# Patient Record
Sex: Male | Born: 1960
Health system: Southern US, Community
[De-identification: ages and names within clinical notes are randomized; demographics above are authoritative.]

## PROBLEM LIST (undated history)

## (undated) DIAGNOSIS — I10 Essential (primary) hypertension: Secondary | ICD-10-CM

## (undated) DIAGNOSIS — B001 Herpesviral vesicular dermatitis: Secondary | ICD-10-CM

## (undated) DIAGNOSIS — E785 Hyperlipidemia, unspecified: Secondary | ICD-10-CM

## (undated) DIAGNOSIS — H6120 Impacted cerumen, unspecified ear: Secondary | ICD-10-CM

## (undated) DIAGNOSIS — D489 Neoplasm of uncertain behavior, unspecified: Secondary | ICD-10-CM

## (undated) HISTORY — PX: APPENDECTOMY: SHX54

## (undated) HISTORY — DX: Neoplasm of uncertain behavior, unspecified: D48.9

## (undated) HISTORY — DX: Herpesviral vesicular dermatitis: B00.1

## (undated) HISTORY — DX: Hyperlipidemia, unspecified: E78.5

## (undated) HISTORY — PX: SHOULDER SURGERY: SHX246

## (undated) HISTORY — DX: Essential (primary) hypertension: I10

## (undated) HISTORY — DX: Impacted cerumen, unspecified ear: H61.20

---

## 2009-10-08 LAB — HM COLONOSCOPY

## 2009-12-31 ENCOUNTER — Encounter: Admission: RE | Admit: 2009-12-31 | Discharge: 2010-03-31 | Payer: Self-pay | Admitting: Orthopedic Surgery

## 2010-11-21 LAB — FECAL OCCULT BLOOD, GUAIAC: Fecal Occult Blood: NEGATIVE

## 2010-11-27 ENCOUNTER — Encounter: Payer: Self-pay | Admitting: Family Medicine

## 2010-11-27 DIAGNOSIS — E785 Hyperlipidemia, unspecified: Secondary | ICD-10-CM | POA: Insufficient documentation

## 2010-11-27 DIAGNOSIS — E1169 Type 2 diabetes mellitus with other specified complication: Secondary | ICD-10-CM | POA: Insufficient documentation

## 2010-11-27 DIAGNOSIS — B001 Herpesviral vesicular dermatitis: Secondary | ICD-10-CM | POA: Insufficient documentation

## 2010-11-27 DIAGNOSIS — H6120 Impacted cerumen, unspecified ear: Secondary | ICD-10-CM | POA: Insufficient documentation

## 2010-11-27 DIAGNOSIS — B009 Herpesviral infection, unspecified: Secondary | ICD-10-CM | POA: Insufficient documentation

## 2010-11-27 DIAGNOSIS — D489 Neoplasm of uncertain behavior, unspecified: Secondary | ICD-10-CM

## 2013-03-28 ENCOUNTER — Other Ambulatory Visit (INDEPENDENT_AMBULATORY_CARE_PROVIDER_SITE_OTHER): Payer: BC Managed Care – PPO

## 2013-03-28 DIAGNOSIS — R5381 Other malaise: Secondary | ICD-10-CM

## 2013-03-28 DIAGNOSIS — I10 Essential (primary) hypertension: Secondary | ICD-10-CM

## 2013-03-28 DIAGNOSIS — E559 Vitamin D deficiency, unspecified: Secondary | ICD-10-CM

## 2013-03-28 DIAGNOSIS — R5383 Other fatigue: Secondary | ICD-10-CM

## 2013-03-28 DIAGNOSIS — E785 Hyperlipidemia, unspecified: Secondary | ICD-10-CM

## 2013-03-28 LAB — HEPATIC FUNCTION PANEL
ALT: 22 U/L (ref 0–53)
AST: 28 U/L (ref 0–37)
Albumin: 4.4 g/dL (ref 3.5–5.2)
Alkaline Phosphatase: 60 U/L (ref 39–117)
Bilirubin, Direct: 0.1 mg/dL (ref 0.0–0.3)
Indirect Bilirubin: 0.5 mg/dL (ref 0.0–0.9)
Total Bilirubin: 0.6 mg/dL (ref 0.3–1.2)
Total Protein: 6.8 g/dL (ref 6.0–8.3)

## 2013-03-28 LAB — POCT CBC
Granulocyte percent: 72.8 %G (ref 37–80)
HCT, POC: 44.8 % (ref 43.5–53.7)
Hemoglobin: 14.9 g/dL (ref 14.1–18.1)
Lymph, poc: 1.1 (ref 0.6–3.4)
MCH, POC: 29.1 pg (ref 27–31.2)
MCHC: 33.3 g/dL (ref 31.8–35.4)
MCV: 87.5 fL (ref 80–97)
MPV: 8.1 fL (ref 0–99.8)
POC Granulocyte: 3.6 (ref 2–6.9)
POC LYMPH PERCENT: 21.6 %L (ref 10–50)
Platelet Count, POC: 168 10*3/uL (ref 142–424)
RBC: 5.1 M/uL (ref 4.69–6.13)
RDW, POC: 13.2 %
WBC: 5 10*3/uL (ref 4.6–10.2)

## 2013-03-28 LAB — BASIC METABOLIC PANEL WITH GFR
BUN: 14 mg/dL (ref 6–23)
CO2: 28 mEq/L (ref 19–32)
Calcium: 9.3 mg/dL (ref 8.4–10.5)
Chloride: 105 mEq/L (ref 96–112)
Creat: 0.95 mg/dL (ref 0.50–1.35)
GFR, Est African American: >89 mL/min
GFR, Est Non African American: >89 mL/min
Glucose, Bld: 99 mg/dL (ref 70–99)
Potassium: 4.8 mEq/L (ref 3.5–5.3)
Sodium: 138 mEq/L (ref 135–145)

## 2013-03-28 NOTE — Progress Notes (Signed)
Patient came in for labs only.

## 2013-03-28 NOTE — Progress Notes (Signed)
Pt here for labs only. 

## 2013-03-29 LAB — NMR LIPOPROFILE WITH LIPIDS
Cholesterol, Total: 117 mg/dL (ref <200)
HDL Particle Number: 33.9 umol/L (ref >=30.5)
HDL Size: 8.5 nm — ABNORMAL LOW (ref >=9.2)
HDL-C: 40 mg/dL (ref >=40)
LDL (calc): 60 mg/dL (ref <100)
LDL Particle Number: 865 nmol/L (ref <1000)
LDL Size: 20.2 nm — ABNORMAL LOW (ref >20.5)
LP-IR Score: 64 — ABNORMAL HIGH (ref <=45)
Large HDL-P: <1.3 umol/L — ABNORMAL LOW (ref >=4.8)
Large VLDL-P: 1.4 nmol/L (ref <=2.7)
Small LDL Particle Number: 516 nmol/L (ref <=527)
Triglycerides: 83 mg/dL (ref <150)
VLDL Size: 51.3 nm — ABNORMAL HIGH (ref <=46.6)

## 2013-03-29 LAB — VITAMIN D 25 HYDROXY (VIT D DEFICIENCY, FRACTURES): Vit D, 25-Hydroxy: 54 ng/mL (ref 30–89)

## 2013-03-30 ENCOUNTER — Telehealth: Payer: Self-pay | Admitting: *Deleted

## 2013-03-30 NOTE — Telephone Encounter (Signed)
Message copied by Bearl Mulberry on Thu Mar 30, 2013  6:16 PM ------      Message from: Ernestina Penna      Created: Tue Mar 28, 2013  1:11 PM       Normal CBC including hemoglobin WBC and platelets       ------

## 2013-03-30 NOTE — Telephone Encounter (Signed)
Pt notified of results

## 2013-03-30 NOTE — Telephone Encounter (Signed)
Message copied by Bearl Mulberry on Thu Mar 30, 2013  6:16 PM ------      Message from: Ernestina Penna      Created: Wed Mar 29, 2013  2:14 PM       Electrolytes blood sugar and renal function all within normal      Liver function tests within normal limit      All cholesterol numbers by advanced lipid testing were excellent--- continue current treatment and aggressive therapeutic lifestyle changes      Vitamin D was excellent and within normal limits at 54 ------

## 2013-04-03 ENCOUNTER — Encounter: Payer: Self-pay | Admitting: Family Medicine

## 2013-04-03 ENCOUNTER — Ambulatory Visit (INDEPENDENT_AMBULATORY_CARE_PROVIDER_SITE_OTHER): Payer: BC Managed Care – PPO | Admitting: Family Medicine

## 2013-04-03 VITALS — BP 112/78 | HR 82 | Temp 97.0°F | Ht 72.0 in | Wt 236.8 lb

## 2013-04-03 DIAGNOSIS — H6123 Impacted cerumen, bilateral: Secondary | ICD-10-CM

## 2013-04-03 DIAGNOSIS — H6120 Impacted cerumen, unspecified ear: Secondary | ICD-10-CM

## 2013-04-03 DIAGNOSIS — E785 Hyperlipidemia, unspecified: Secondary | ICD-10-CM

## 2013-04-03 DIAGNOSIS — I10 Essential (primary) hypertension: Secondary | ICD-10-CM

## 2013-04-03 DIAGNOSIS — E1159 Type 2 diabetes mellitus with other circulatory complications: Secondary | ICD-10-CM | POA: Insufficient documentation

## 2013-04-03 NOTE — Patient Instructions (Signed)
Continue aggressive therapeutic lifestyle changes Drink plenty of fluids Debrox over-the-counter, 2-3 drops nightly for 3 nights each ear, weight 1 week and repeat

## 2013-04-03 NOTE — Progress Notes (Signed)
  Subjective:    Patient ID: Jeffrey Barnett, male    DOB: April 05, 1961, 52 y.o.   MRN: [id number]  HPI Patient returns to clinic today for followup of chronic medical problems and their management. These include hyperlipidemia hypertension and vitamin D deficiency. Patient along with his wife and her diet has lost about 20lb.   Review of Systems  Constitutional: Negative.   HENT: Positive for sore throat (slight on the Left). Negative for congestion, postnasal drip and sinus pressure.   Eyes: Negative.  Negative for pain, discharge, redness and itching.  Respiratory: Negative.  Negative for cough, chest tightness, shortness of breath and wheezing.   Cardiovascular: Negative for chest pain, palpitations and leg swelling.  Gastrointestinal: Negative.  Negative for nausea, vomiting, abdominal pain, diarrhea and blood in stool.  Endocrine: Negative.   Genitourinary: Negative for dysuria and frequency.  Musculoskeletal: Negative.  Negative for back pain, joint swelling and arthralgias.  Allergic/Immunologic: Negative.   Neurological: Negative.  Negative for dizziness and headaches.  Psychiatric/Behavioral: Negative.        Objective:   Physical Exam BP 112/78  Pulse 82  Temp(Src) 97 F (36.1 C) (Oral)  Wt 236 lb 12.8 oz (107.412 kg)  The patient appeared well nourished and normally developed, alert and oriented to time and place. Speech, behavior and judgement appear normal. Vital signs as documented.  Head exam is unremarkable. No scleral icterus or pallor noted. There was ear cerumen bilaterally left greater than right. Mouth and throat were normal. Passages were clear. Neck is without jugular venous distension, thyromegally, or carotid bruits. Carotid upstrokes are brisk bilaterally. No cervical adenopathy. Lungs are clear anteriorly and posteriorly to auscultation. Normal respiratory effort. Cardiac exam reveals regular rate and rhythm at 72 per minute. First and second heart sounds  normal.  No murmurs, rubs or gallops.  Abdominal exam reveals normal bowl sounds, no masses, no organomegaly and no aortic enlargement. No inguinal adenopathy. There is no abdominal tenderness. Extremities are nonedematous and both femoral  pulses are normal. Skin without pallor or jaundice.  Warm and dry, without rash. Neurologic exam reveals normal deep tendon reflexes and normal sensation.          Assessment & Plan:  Hyperlipidemia  Hypertension  Impacted cerumen of both ears  Patient Instructions  Continue aggressive therapeutic lifestyle changes Drink plenty of fluids Debrox over-the-counter, 2-3 drops nightly for 3 nights each ear, weight 1 week and repeat   Continue current medications  Nyra Capes MD

## 2013-05-30 ENCOUNTER — Telehealth: Payer: Self-pay | Admitting: *Deleted

## 2013-05-30 ENCOUNTER — Other Ambulatory Visit: Payer: Self-pay | Admitting: *Deleted

## 2013-05-30 DIAGNOSIS — N529 Male erectile dysfunction, unspecified: Secondary | ICD-10-CM

## 2013-05-30 MED ORDER — ROSUVASTATIN CALCIUM 10 MG PO TABS: 10.0000 mg | ORAL_TABLET | Freq: Every day | ORAL | Status: DC

## 2013-05-30 MED ORDER — SILDENAFIL CITRATE 100 MG PO TABS: 50.0000 mg | ORAL_TABLET | Freq: Every day | ORAL | Status: DC | PRN

## 2013-05-30 NOTE — Telephone Encounter (Signed)
Pt wants rx for viagra sent to local pharm.- [facility]

## 2013-05-30 NOTE — Telephone Encounter (Signed)
Pt needed rx crestor for mail order

## 2013-08-08 ENCOUNTER — Encounter: Payer: Self-pay | Admitting: Family Medicine

## 2013-08-08 ENCOUNTER — Ambulatory Visit (INDEPENDENT_AMBULATORY_CARE_PROVIDER_SITE_OTHER): Payer: BC Managed Care – PPO | Admitting: Family Medicine

## 2013-08-08 VITALS — BP 123/82 | HR 70 | Temp 98.3°F | Ht 72.0 in | Wt 237.0 lb

## 2013-08-08 DIAGNOSIS — I10 Essential (primary) hypertension: Secondary | ICD-10-CM

## 2013-08-08 DIAGNOSIS — E559 Vitamin D deficiency, unspecified: Secondary | ICD-10-CM

## 2013-08-08 DIAGNOSIS — Z23 Encounter for immunization: Secondary | ICD-10-CM

## 2013-08-08 DIAGNOSIS — E785 Hyperlipidemia, unspecified: Secondary | ICD-10-CM

## 2013-08-08 DIAGNOSIS — N400 Enlarged prostate without lower urinary tract symptoms: Secondary | ICD-10-CM

## 2013-08-08 NOTE — Progress Notes (Signed)
Subjective:    Patient ID: Jeffrey Barnett, male    DOB: 06-06-1961, 52 y.o.   MRN: [id number]  HPI Pt here for follow up and management of chronic medical problems. Patient is doing well overall and has had some recent congestion which seems to be getting better       Patient Active Problem List   Diagnosis Date Noted  . Hypertension 04/03/2013  . Fever blister, history of   . Hyperlipidemia   . Villous adenoma, history of    Outpatient Encounter Prescriptions as of 08/08/2013  Medication Sig  . Cholecalciferol (VITAMIN D) 2000 UNITS CAPS Take 1 capsule by mouth daily.  . enalapril (VASOTEC) 10 MG tablet Take 10 mg by mouth daily.    . Omega-3 Fatty Acids (FISH OIL) 1000 MG CAPS Take 2 capsules by mouth daily.    . rosuvastatin (CRESTOR) 10 MG tablet Take 1 tablet (10 mg total) by mouth daily.  . valACYclovir (VALTREX) 1000 MG tablet Take 1,000 mg by mouth 2 (two) times daily.    . sildenafil (VIAGRA) 100 MG tablet Take 0.5-1 tablets (50-100 mg total) by mouth daily as needed for erectile dysfunction.    Review of Systems  Constitutional: Negative.   HENT: Positive for congestion (getting better).   Eyes: Negative.   Respiratory: Negative.   Cardiovascular: Negative.   Gastrointestinal: Negative.   Endocrine: Negative.   Genitourinary: Negative.   Musculoskeletal: Negative.   Skin: Negative.   Allergic/Immunologic: Negative.   Neurological: Negative.   Hematological: Negative.   Psychiatric/Behavioral: Negative.        Objective:   Physical Exam  Nursing note and vitals reviewed. Constitutional: He is oriented to person, place, and time. He appears well-developed and well-nourished.  HENT:  Head: Normocephalic and atraumatic.  Right Ear: External ear normal.  Left Ear: External ear normal.  Mouth/Throat: Oropharynx is clear and moist. No oropharyngeal exudate.  Some ear cerumen bilateral, nasal congestion bilaterally  Eyes: Conjunctivae and EOM are normal.  Pupils are equal, round, and reactive to light. Right eye exhibits no discharge. Left eye exhibits no discharge. No scleral icterus.  Neck: Normal range of motion. Neck supple. No thyromegaly present.  Cardiovascular: Normal rate, regular rhythm, normal heart sounds and intact distal pulses.  Exam reveals no gallop.   No murmur heard. At 72 per minute  Pulmonary/Chest: Effort normal and breath sounds normal. No respiratory distress. He has no wheezes. He has no rales. He exhibits no tenderness.  Abdominal: Soft. Bowel sounds are normal. He exhibits no mass. There is no tenderness. There is no rebound and no guarding.  Genitourinary: Rectum normal and penis normal.  Prostate was slightly enlarged left greater than right no lumps. No rectal masses.  Musculoskeletal: Normal range of motion. He exhibits no edema and no tenderness.  Lymphadenopathy:    He has no cervical adenopathy.  Neurological: He is alert and oriented to person, place, and time. He has normal reflexes. No cranial nerve deficit.  Skin: Skin is warm and dry. No rash noted. No erythema. No pallor.  Psychiatric: He has a normal mood and affect. His behavior is normal. Judgment and thought content normal.   BP 123/82  Pulse 70  Temp(Src) 98.3 F (36.8 C) (Oral)  Ht 6' (1.829 m)  Wt 237 lb (107.502 kg)  BMI 32.14 kg/m2        Assessment & Plan:   1. Hypertension - POCT CBC; Future - Hepatic function panel; Future - BMP8+EGFR; Future  2. Hyperlipidemia - POCT CBC; Future - NMR, lipoprofile; Future  3. BPH (benign prostatic hyperplasia) - PSA, total and free; Future  4. Vitamin D deficiency - Vit D  25 hydroxy (rtn osteoporosis monitoring); Future  5. Need for prophylactic vaccination and inoculation against influenza   No orders of the defined types were placed in this encounter.   Patient Instructions  Continue current medications. Continue good therapeutic lifestyle changes which include good diet and  exercise. Fall precautions discussed with patient. Schedule your flu vaccine if you haven't had it yet If you are over 80 years old - you may need Prevnar 13 or the adult Pneumonia vaccine. Please check with her insurance regarding the Prevnar vaccine He will receive a flu shot today Return the FOBT card Return to clinic for lab work   Use saline nose spray and a cool mist humidifier in her bedroom at night Nyra Capes MD

## 2013-08-08 NOTE — Patient Instructions (Addendum)
Continue current medications. Continue good therapeutic lifestyle changes which include good diet and exercise. Fall precautions discussed with patient. Schedule your flu vaccine if you haven't had it yet If you are over 54 years old - you may need Prevnar 13 or the adult Pneumonia vaccine. Please check with her insurance regarding the Prevnar vaccine He will receive a flu shot today Return the FOBT card Return to clinic for lab work

## 2013-09-18 ENCOUNTER — Other Ambulatory Visit: Payer: Self-pay | Admitting: Family Medicine

## 2013-09-20 NOTE — Telephone Encounter (Signed)
Last seen 08/08/13  DWM

## 2013-09-25 ENCOUNTER — Encounter: Payer: Self-pay | Admitting: *Deleted

## 2013-11-22 ENCOUNTER — Telehealth: Payer: Self-pay | Admitting: Family Medicine

## 2013-11-22 NOTE — Telephone Encounter (Signed)
Surgery on left shoulder in 2011 by [facility].  Right shoulder is getting sore and feels similar to how the left one did prior to surgery.  Appt scheduled for 11/28/13 with Dr. Laurance Flatten.

## 2013-11-26 ENCOUNTER — Other Ambulatory Visit: Payer: Self-pay | Admitting: Family Medicine

## 2013-11-28 ENCOUNTER — Encounter: Payer: Self-pay | Admitting: Family Medicine

## 2013-11-28 ENCOUNTER — Ambulatory Visit (INDEPENDENT_AMBULATORY_CARE_PROVIDER_SITE_OTHER): Payer: BC Managed Care – PPO | Admitting: Family Medicine

## 2013-11-28 ENCOUNTER — Ambulatory Visit (INDEPENDENT_AMBULATORY_CARE_PROVIDER_SITE_OTHER): Payer: BC Managed Care – PPO

## 2013-11-28 VITALS — BP 133/83 | HR 71 | Temp 96.7°F | Ht 72.0 in | Wt 250.0 lb

## 2013-11-28 DIAGNOSIS — M25511 Pain in right shoulder: Secondary | ICD-10-CM

## 2013-11-28 DIAGNOSIS — M25519 Pain in unspecified shoulder: Secondary | ICD-10-CM

## 2013-11-28 MED ORDER — MELOXICAM 15 MG PO TABS: 15.0000 mg | ORAL_TABLET | Freq: Every day | ORAL | Status: DC

## 2013-11-28 NOTE — Patient Instructions (Signed)
Use warm wet compresses Take meds as directed Follow in 2 weeks with Roselyn Reef! - if no better we will schedule MRI

## 2013-11-28 NOTE — Progress Notes (Signed)
   Subjective:    Patient ID: Jeffrey Barnett, male    DOB: 24-Jan-1961, 53 y.o.   MRN: [id number]  HPI Patent here today for right shoulder pain. This pain has been going on for about 2 weeks. There is no history of any injury. It also hurts him at night time. He has a previous history in the left shoulder of a torn labrum and partial rotator cuff tear.       Patient Active Problem List   Diagnosis Date Noted  . Hypertension 04/03/2013  . Fever blister, history of   . Hyperlipidemia   . Villous adenoma, history of    Outpatient Encounter Prescriptions as of 11/28/2013  Medication Sig  . Cholecalciferol (VITAMIN D) 2000 UNITS CAPS Take 1 capsule by mouth daily.  . enalapril (VASOTEC) 10 MG tablet TAKE 1 TABLET EVERY DAY  . Omega-3 Fatty Acids (FISH OIL) 1000 MG CAPS Take 2 capsules by mouth daily.    . rosuvastatin (CRESTOR) 10 MG tablet Take 1 tablet (10 mg total) by mouth daily.  . sildenafil (VIAGRA) 100 MG tablet Take 0.5-1 tablets (50-100 mg total) by mouth daily as needed for erectile dysfunction.  . valACYclovir (VALTREX) 1000 MG tablet TAKE 1 TABLET EVERY DAY AS DIRECTED  . [DISCONTINUED] enalapril (VASOTEC) 10 MG tablet Take 10 mg by mouth daily.      Review of Systems  Constitutional: Negative.   HENT: Negative.   Eyes: Negative.   Respiratory: Negative.   Cardiovascular: Negative.   Gastrointestinal: Negative.   Endocrine: Negative.   Genitourinary: Negative.   Musculoskeletal: Positive for arthralgias (right shoulder pain).  Skin: Negative.   Allergic/Immunologic: Negative.   Neurological: Negative.   Hematological: Negative.   Psychiatric/Behavioral: Negative.        Objective:   Physical Exam  Nursing note and vitals reviewed. Constitutional: He is oriented to person, place, and time. He appears well-developed and well-nourished. No distress.  HENT:  Head: Normocephalic and atraumatic.  Eyes: Conjunctivae and EOM are normal. Pupils are equal, round, and  reactive to light. Right eye exhibits no discharge. Left eye exhibits no discharge. No scleral icterus.  Neck: Normal range of motion. Neck supple.  Musculoskeletal: Normal range of motion. He exhibits tenderness. He exhibits no edema.  Tender superior and anterior right shoulder with limited range of motion posteriorly of the upper arm.  Neurological: He is alert and oriented to person, place, and time.  Skin: Skin is warm and dry. No rash noted.  Psychiatric: He has a normal mood and affect. His behavior is normal. Judgment and thought content normal.   BP 133/83  Pulse 71  Temp(Src) 96.7 F (35.9 C) (Oral)  Ht 6' (1.829 m)  Wt 250 lb (113.399 kg)  BMI 33.90 kg/m2  [facility] reading (PRIMARY) by  Dr. Governor Rooks shoulder--mild degenerative changes right a.c. joint                                       Assessment & Plan:  1. Right shoulder pain - DG Shoulder Right; Future  Patient Instructions  Use warm wet compresses Take meds as directed Follow in 2 weeks with Roselyn Reef! - if no better we will schedule MRI   Arrie Senate MD

## 2013-12-05 ENCOUNTER — Ambulatory Visit: Payer: BC Managed Care – PPO | Admitting: Family Medicine

## 2013-12-06 ENCOUNTER — Ambulatory Visit: Payer: BC Managed Care – PPO | Admitting: Family Medicine

## 2013-12-18 ENCOUNTER — Telehealth: Payer: Self-pay | Admitting: Family Medicine

## 2013-12-18 NOTE — Telephone Encounter (Signed)
Left message for patient to return call. Asked that he provide switchboard with details if I am not available when he calls.  He can let them know if the shoulder is doing better or worse and any other pertinent details, that way we don't have to play phone tag as much.

## 2013-12-18 NOTE — Telephone Encounter (Signed)
Right shoulder is some better but still aches and has some decreased range of motion. Should he continue with the Mobic until his appt on the 30th with Dr. Laurance Flatten? Do you want to proceed with scheduling MRI?

## 2013-12-18 NOTE — Telephone Encounter (Signed)
The x-ray showed degenerative changes, continue anti-inflammatory medication, as long as it is not bothering his stomach. Also use warm compresses with good range of motion exercises. If he needs a refill this may be refilled.

## 2013-12-19 NOTE — Telephone Encounter (Signed)
Patient is tolerating Mobic with no problems. He is applying warm compresses and doing ROM exercises. He will follow up at the end of the month as planned and sooner if necessary.

## 2014-01-04 ENCOUNTER — Ambulatory Visit (INDEPENDENT_AMBULATORY_CARE_PROVIDER_SITE_OTHER): Payer: BC Managed Care – PPO | Admitting: Family Medicine

## 2014-01-04 ENCOUNTER — Encounter: Payer: Self-pay | Admitting: Family Medicine

## 2014-01-04 ENCOUNTER — Ambulatory Visit (INDEPENDENT_AMBULATORY_CARE_PROVIDER_SITE_OTHER): Payer: BC Managed Care – PPO

## 2014-01-04 VITALS — BP 140/80 | HR 74 | Temp 98.2°F | Ht 72.0 in | Wt 250.0 lb

## 2014-01-04 DIAGNOSIS — Z0000 Encounter for general adult medical examination without abnormal findings: Secondary | ICD-10-CM

## 2014-01-04 DIAGNOSIS — I10 Essential (primary) hypertension: Secondary | ICD-10-CM

## 2014-01-04 DIAGNOSIS — M25519 Pain in unspecified shoulder: Secondary | ICD-10-CM

## 2014-01-04 DIAGNOSIS — E785 Hyperlipidemia, unspecified: Secondary | ICD-10-CM

## 2014-01-04 DIAGNOSIS — M25511 Pain in right shoulder: Secondary | ICD-10-CM

## 2014-01-04 NOTE — Progress Notes (Signed)
Subjective:    Patient ID: Jeffrey Barnett, male    DOB: 11/04/1960, 53 y.o.   MRN: [id number]  HPI Pt here for follow up and management of chronic medical problems. The patient continues to have problems with his right shoulder. He has a history of a left shoulder torn labrum. He's been taking anti-inflammatory medicines for several weeks. He is no better.        Patient Active Problem List   Diagnosis Date Noted  . Hypertension 04/03/2013  . Fever blister, history of   . Hyperlipidemia   . Villous adenoma, history of    Outpatient Encounter Prescriptions as of 01/04/2014  Medication Sig  . Cholecalciferol (VITAMIN D) 2000 UNITS CAPS Take 1 capsule by mouth daily.  . enalapril (VASOTEC) 10 MG tablet TAKE 1 TABLET EVERY DAY  . meloxicam (MOBIC) 15 MG tablet Take 1 tablet (15 mg total) by mouth daily.  . Omega-3 Fatty Acids (FISH OIL) 1000 MG CAPS Take 2 capsules by mouth daily.    . rosuvastatin (CRESTOR) 10 MG tablet Take 1 tablet (10 mg total) by mouth daily.  . sildenafil (VIAGRA) 100 MG tablet Take 0.5-1 tablets (50-100 mg total) by mouth daily as needed for erectile dysfunction.  . valACYclovir (VALTREX) 1000 MG tablet TAKE 1 TABLET EVERY DAY AS DIRECTED    Review of Systems  Constitutional: Negative.   HENT: Negative.   Eyes: Negative.   Respiratory: Negative.   Cardiovascular: Negative.   Gastrointestinal: Negative.   Endocrine: Negative.   Genitourinary: Negative.   Musculoskeletal: Positive for arthralgias (right shoulder still painful).  Skin: Negative.   Allergic/Immunologic: Negative.   Neurological: Negative.   Hematological: Negative.   Psychiatric/Behavioral: Negative.        Objective:   Physical Exam  Nursing note and vitals reviewed. Constitutional: He is oriented to person, place, and time. He appears well-developed and well-nourished. No distress.  HENT:  Head: Normocephalic and atraumatic.  Right Ear: External ear normal.  Left Ear: External  ear normal.  Nose: Nose normal.  Mouth/Throat: Oropharynx is clear and moist. No oropharyngeal exudate.  Eyes: Conjunctivae and EOM are normal. Pupils are equal, round, and reactive to light. Right eye exhibits no discharge. Left eye exhibits no discharge. No scleral icterus.  Neck: Normal range of motion. Neck supple. No thyromegaly present.  Cardiovascular: Normal rate, regular rhythm and normal heart sounds.   No murmur heard. Pulmonary/Chest: Effort normal and breath sounds normal. No respiratory distress. He has no wheezes. He has no rales. He exhibits no tenderness.  Abdominal: Soft. Bowel sounds are normal. He exhibits no mass. There is no tenderness. There is no rebound and no guarding.  Musculoskeletal: He exhibits no edema and no tenderness.  Pain with abduction of the right shoulder. No specific point tenderness.  Lymphadenopathy:    He has no cervical adenopathy.  Neurological: He is alert and oriented to person, place, and time. He has normal reflexes. No cranial nerve deficit.  Skin: Skin is warm and dry. No rash noted.  Psychiatric: He has a normal mood and affect. His behavior is normal. Judgment and thought content normal.   BP 140/80  Pulse 74  Temp(Src) 98.2 F (36.8 C) (Oral)  Ht 6' (1.829 m)  Wt 250 lb (113.399 kg)  BMI 33.90 kg/m2  [facility] reading (PRIMARY) by  DrMoore-chest x-ray-no active disease  Assessment & Plan:  1. Hyperlipidemia - POCT glycosylated hemoglobin (Hb A1C); Future  2. Hypertension - DG Chest 2 View; Future  3. Health care maintenance - POCT glycosylated hemoglobin (Hb A1C); Future  4. Right shoulder pain - MR Shoulder Right Wo Contrast; Future  Patient Instructions  Continue current medications. Continue good therapeutic lifestyle changes which include good diet and exercise. Fall precautions discussed with patient. If an FOBT was given today- please return it to our front desk. If you are  over 30 years old - you may need Prevnar 53 or the adult Pneumonia vaccine.  We will arrange to get an MRI of your right shoulder and we'll call you with the appointment as soon as possible Continue to take the Mobic until we can find out the cause of the pain   Arrie Senate MD

## 2014-01-04 NOTE — Patient Instructions (Addendum)
Continue current medications. Continue good therapeutic lifestyle changes which include good diet and exercise. Fall precautions discussed with patient. If an FOBT was given today- please return it to our front desk. If you are over 55 years old - you may need Prevnar 25 or the adult Pneumonia vaccine.  We will arrange to get an MRI of your right shoulder and we'll call you with the appointment as soon as possible Continue to take the Mobic until we can find out the cause of the pain

## 2014-01-09 ENCOUNTER — Ambulatory Visit (HOSPITAL_BASED_OUTPATIENT_CLINIC_OR_DEPARTMENT_OTHER)
Admission: RE | Admit: 2014-01-09 | Discharge: 2014-01-09 | Disposition: A | Discharge status: Home / Self Care | Payer: BC Managed Care – PPO | Source: Ambulatory Visit | Attending: Family Medicine | Admitting: Family Medicine

## 2014-01-09 DIAGNOSIS — X58XXXA Exposure to other specified factors, initial encounter: Secondary | ICD-10-CM | POA: Insufficient documentation

## 2014-01-09 DIAGNOSIS — R937 Abnormal findings on diagnostic imaging of other parts of musculoskeletal system: Secondary | ICD-10-CM | POA: Insufficient documentation

## 2014-01-09 DIAGNOSIS — M629 Disorder of muscle, unspecified: Secondary | ICD-10-CM | POA: Insufficient documentation

## 2014-01-09 DIAGNOSIS — S46819A Strain of other muscles, fascia and tendons at shoulder and upper arm level, unspecified arm, initial encounter: Secondary | ICD-10-CM | POA: Insufficient documentation

## 2014-01-09 DIAGNOSIS — M2420 Disorder of ligament, unspecified site: Secondary | ICD-10-CM | POA: Insufficient documentation

## 2014-01-09 DIAGNOSIS — Y929 Unspecified place or not applicable: Secondary | ICD-10-CM | POA: Insufficient documentation

## 2014-01-09 DIAGNOSIS — M25511 Pain in right shoulder: Secondary | ICD-10-CM

## 2014-01-10 ENCOUNTER — Telehealth: Payer: Self-pay

## 2014-01-10 NOTE — Telephone Encounter (Signed)
Pt given results of MRI and appointment date/time with Dr. Veverly Fells  Of 01/24/14 at 10:00am

## 2014-01-10 NOTE — Telephone Encounter (Signed)
Message copied by Koren Bound on Wed Jan 10, 2014 11:49 AM ------      Message from: Jeffrey Barnett      Created: Wed Jan 10, 2014  9:50 AM       As per radiology report-------- please call this patient and arrange for him to have a visit with the orthopedic surgeon that he is seen in the past. You can explain  the findings from the interpretation to him ------

## 2014-01-24 ENCOUNTER — Other Ambulatory Visit (INDEPENDENT_AMBULATORY_CARE_PROVIDER_SITE_OTHER): Payer: BC Managed Care – PPO

## 2014-01-24 DIAGNOSIS — R7309 Other abnormal glucose: Secondary | ICD-10-CM

## 2014-01-24 DIAGNOSIS — E785 Hyperlipidemia, unspecified: Secondary | ICD-10-CM

## 2014-01-24 DIAGNOSIS — I10 Essential (primary) hypertension: Secondary | ICD-10-CM

## 2014-01-24 DIAGNOSIS — E559 Vitamin D deficiency, unspecified: Secondary | ICD-10-CM

## 2014-01-24 DIAGNOSIS — N400 Enlarged prostate without lower urinary tract symptoms: Secondary | ICD-10-CM

## 2014-01-24 LAB — POCT GLYCOSYLATED HEMOGLOBIN (HGB A1C): Hemoglobin A1C: 5.7

## 2014-01-24 NOTE — Progress Notes (Signed)
Pt came in for labs only 

## 2014-01-24 NOTE — Addendum Note (Signed)
Addended by: Wyline Mood on: 01/24/2014 09:14 AM   Modules accepted: Orders

## 2014-01-25 LAB — CBC WITH DIFFERENTIAL
Basophils Absolute: 0 10*3/uL (ref 0.0–0.2)
Basos: 0 %
Eos: 1 %
Eosinophils Absolute: 0.1 10*3/uL (ref 0.0–0.4)
HCT: 44.8 % (ref 37.5–51.0)
Hemoglobin: 15.2 g/dL (ref 12.6–17.7)
Immature Grans (Abs): 0 10*3/uL (ref 0.0–0.1)
Immature Granulocytes: 0 %
Lymphocytes Absolute: 1.3 10*3/uL (ref 0.7–3.1)
Lymphs: 21 %
MCH: 29.6 pg (ref 26.6–33.0)
MCHC: 33.9 g/dL (ref 31.5–35.7)
MCV: 87 fL (ref 79–97)
Monocytes Absolute: 0.5 10*3/uL (ref 0.1–0.9)
Monocytes: 9 %
Neutrophils Absolute: 4.3 10*3/uL (ref 1.4–7.0)
Neutrophils Relative %: 69 %
Platelets: 200 10*3/uL (ref 150–379)
RBC: 5.14 x10E6/uL (ref 4.14–5.80)
RDW: 14 % (ref 12.3–15.4)
WBC: 6.3 10*3/uL (ref 3.4–10.8)

## 2014-01-25 LAB — BMP8+EGFR
BUN/Creatinine Ratio: 19 (ref 9–20)
BUN: 20 mg/dL (ref 6–24)
CO2: 25 mmol/L (ref 18–29)
Calcium: 9.3 mg/dL (ref 8.7–10.2)
Chloride: 100 mmol/L (ref 97–108)
Creatinine, Ser: 1.08 mg/dL (ref 0.76–1.27)
GFR calc Af Amer: 89 mL/min/{1.73_m2} (ref >59)
GFR calc non Af Amer: 77 mL/min/{1.73_m2} (ref >59)
Glucose: 106 mg/dL — ABNORMAL HIGH (ref 65–99)
Potassium: 5.4 mmol/L — ABNORMAL HIGH (ref 3.5–5.2)
Sodium: 138 mmol/L (ref 134–144)

## 2014-01-25 LAB — HEPATIC FUNCTION PANEL
ALT: 20 IU/L (ref 0–44)
AST: 21 IU/L (ref 0–40)
Albumin: 4.5 g/dL (ref 3.5–5.5)
Alkaline Phosphatase: 71 IU/L (ref 39–117)
Bilirubin, Direct: 0.15 mg/dL (ref 0.00–0.40)
Total Bilirubin: 0.6 mg/dL (ref 0.0–1.2)
Total Protein: 6.7 g/dL (ref 6.0–8.5)

## 2014-01-25 LAB — NMR, LIPOPROFILE
Cholesterol: 153 mg/dL (ref 100–199)
HDL Cholesterol by NMR: 48 mg/dL (ref >39)
HDL Particle Number: 38.7 umol/L (ref >=30.5)
LDL Particle Number: 1104 nmol/L — ABNORMAL HIGH (ref <1000)
LDL Size: 20.4 nm (ref >20.5)
LDLC SERPL CALC-MCNC: 80 mg/dL (ref 0–99)
LP-IR Score: 70 — ABNORMAL HIGH (ref <=45)
Small LDL Particle Number: 656 nmol/L — ABNORMAL HIGH (ref <=527)
Triglycerides by NMR: 127 mg/dL (ref 0–149)

## 2014-01-25 LAB — PSA, TOTAL AND FREE
PSA, Free Pct: 22.2 %
PSA, Free: 0.2 ng/mL
PSA: 0.9 ng/mL (ref 0.0–4.0)

## 2014-01-25 LAB — VITAMIN D 25 HYDROXY (VIT D DEFICIENCY, FRACTURES): Vit D, 25-Hydroxy: 30.7 ng/mL (ref 30.0–100.0)

## 2014-02-06 ENCOUNTER — Other Ambulatory Visit (INDEPENDENT_AMBULATORY_CARE_PROVIDER_SITE_OTHER): Payer: BC Managed Care – PPO

## 2014-02-06 DIAGNOSIS — R7989 Other specified abnormal findings of blood chemistry: Secondary | ICD-10-CM

## 2014-02-06 NOTE — Progress Notes (Unsigned)
Pt came in for labs only 

## 2014-02-07 LAB — BMP8+EGFR
BUN/Creatinine Ratio: 19 (ref 9–20)
BUN: 19 mg/dL (ref 6–24)
CO2: 26 mmol/L (ref 18–29)
Calcium: 9.4 mg/dL (ref 8.7–10.2)
Chloride: 99 mmol/L (ref 97–108)
Creatinine, Ser: 1 mg/dL (ref 0.76–1.27)
GFR calc Af Amer: 98 mL/min/{1.73_m2} (ref >59)
GFR calc non Af Amer: 84 mL/min/{1.73_m2} (ref >59)
Glucose: 103 mg/dL — ABNORMAL HIGH (ref 65–99)
Potassium: 4.9 mmol/L (ref 3.5–5.2)
Sodium: 137 mmol/L (ref 134–144)

## 2014-02-22 ENCOUNTER — Other Ambulatory Visit: Payer: Self-pay | Admitting: Family Medicine

## 2014-02-28 ENCOUNTER — Ambulatory Visit (INDEPENDENT_AMBULATORY_CARE_PROVIDER_SITE_OTHER): Payer: BC Managed Care – PPO | Admitting: Physician Assistant

## 2014-02-28 ENCOUNTER — Encounter: Payer: Self-pay | Admitting: Physician Assistant

## 2014-02-28 VITALS — BP 124/72 | Temp 98.2°F | Ht 72.0 in | Wt 249.0 lb

## 2014-02-28 DIAGNOSIS — B356 Tinea cruris: Secondary | ICD-10-CM

## 2014-02-28 NOTE — Patient Instructions (Signed)
Jock Itch Jock itch is a fungal infection of the skin in the groin area. It is sometimes called "ringworm" even though it is not caused by a worm. A fungus is a type of germ that thrives in dark, damp places.  CAUSES  This infection may spread from:  A fungus infection elsewhere on the body (such as athlete's foot).  Sharing towels or clothing. This infection is more common in:  Hot, humid climates.  People who wear tight-fitting clothing or wet bathing suits for long periods of time.  Athletes.  Overweight people.  People with diabetes. SYMPTOMS  Jock itch causes the following symptoms:  Red, pink or brown rash in the groin. Rash may spread to the thighs, anus, and buttocks.  Itching. DIAGNOSIS  Your caregiver may make the diagnosis by looking at the rash. Sometimes a skin scraping will be sent to test for fungus. Testing can be done either by looking under the microscope or by doing a culture (test to try to grow the fungus). A culture can take up to 2 weeks to come back. TREATMENT  Jock itch may be treated with:  Skin cream or ointment to kill fungus.  Medicine by mouth to kill fungus.  Skin cream or ointment to calm the itching.  Compresses or medicated powders to dry the infected skin. HOME CARE INSTRUCTIONS   Be sure to treat the rash completely. Follow your caregiver's instructions. It can take a couple of weeks to treat. If you do not treat the infection long enough, the rash can come back.  Wear loose-fitting clothing.  Men should wear cotton boxer shorts.  Women should wear cotton underwear.  Avoid hot baths.  Dry the groin area well after bathing. SEEK MEDICAL CARE IF:   Your rash is worse.  Your rash is spreading.  Your rash returns after treatment is finished.  Your rash is not gone in 4 weeks. Fungal infections are slow to respond to treatment. Some redness may remain for several weeks after the fungus is gone. SEEK IMMEDIATE MEDICAL CARE  IF:  The area becomes red, warm, tender, and swollen.  You have a fever. Document Released: 08/14/2002 Document Revised: 11/16/2011 Document Reviewed: 07/13/2008 Exit[employer] Patient Information 2015 [employer]. This information is not intended to replace advice given to you by your health care provider. Make sure you discuss any questions you have with your health care provider.  

## 2014-02-28 NOTE — Progress Notes (Signed)
Subjective:     Patient ID: Jeffrey Barnett, male   DOB: May 03, 1961, 53 y.o.   MRN: [id number]  HPI Pt with a rash to the L groin  Review of Systems + erythema + pruritus No pain or drainage to the site No hx of same No OTC meds used    Objective:   Physical Exam Well demarcated are to the L ing region Raised erythem borders Sl hyperpigmented central area No drainage, ulcerations, vesicles seen    Assessment:     Tinea cruris    Plan:     OTC Clotrimazole Keep area clean/dry Nl course reviewed F/U prn

## 2014-05-01 ENCOUNTER — Other Ambulatory Visit: Payer: Self-pay | Admitting: Family Medicine

## 2014-05-01 ENCOUNTER — Ambulatory Visit (INDEPENDENT_AMBULATORY_CARE_PROVIDER_SITE_OTHER): Payer: BC Managed Care – PPO | Admitting: Family Medicine

## 2014-05-01 ENCOUNTER — Encounter: Payer: Self-pay | Admitting: Family Medicine

## 2014-05-01 VITALS — BP 122/77 | HR 63 | Temp 97.7°F | Ht 72.0 in | Wt 248.0 lb

## 2014-05-01 DIAGNOSIS — I10 Essential (primary) hypertension: Secondary | ICD-10-CM

## 2014-05-01 DIAGNOSIS — D225 Melanocytic nevi of trunk: Secondary | ICD-10-CM

## 2014-05-01 DIAGNOSIS — E785 Hyperlipidemia, unspecified: Secondary | ICD-10-CM

## 2014-05-01 DIAGNOSIS — E559 Vitamin D deficiency, unspecified: Secondary | ICD-10-CM

## 2014-05-01 DIAGNOSIS — H6120 Impacted cerumen, unspecified ear: Secondary | ICD-10-CM

## 2014-05-01 DIAGNOSIS — D235 Other benign neoplasm of skin of trunk: Secondary | ICD-10-CM

## 2014-05-01 DIAGNOSIS — H6122 Impacted cerumen, left ear: Secondary | ICD-10-CM

## 2014-05-01 MED ORDER — ROSUVASTATIN CALCIUM 20 MG PO TABS: 20.0000 mg | ORAL_TABLET | Freq: Every day | ORAL | Status: DC

## 2014-05-01 NOTE — Patient Instructions (Addendum)
Continue current medications. Continue good therapeutic lifestyle changes which include good diet and exercise. Fall precautions discussed with patient. If an FOBT was given today- please return it to our front desk. If you are over 55 years old - you may need Prevnar 40 or the adult Pneumonia vaccine.  Flu Shots will be available at our office starting mid- September. Please call and schedule a FLU CLINIC APPOINTMENT.   Debrox eardrops over-the-counter can be used to help soften ear wax, 3-4 drops nightly for 3 nights wait 1 week and repeat Keep the lesion site cleaned with alcohol for a few days and a Band-Aid that allows the excision site to have air to heal Continue to try to use a coupon for your Crestor prescription to help save money Continue to drink plenty of fluids

## 2014-05-01 NOTE — Progress Notes (Signed)
 Subjective:    Patient ID: Jeffrey Barnett, male    DOB: 12/21/1960, 53 y.o.   MRN: [id number]  HPI Pt here for follow up and management of chronic medical problems. The patient is well no particular complaints other than a mole on his left arm. He would be given FOBT to return. He will return to the office for fasting lab work. He needs to check with his insurance regarding the Prevnar vaccine. We will give him a refill on the Crestor with a coupon to see if he can get it for $3 for 90 days. The patient also says that his wife says that he doesn't hear her well.         Patient Active Problem List   Diagnosis Date Noted  . Hypertension 04/03/2013  . Fever blister, history of   . Hyperlipidemia   . Villous adenoma, history of    Outpatient Encounter Prescriptions as of 05/01/2014  Medication Sig  . Cholecalciferol (VITAMIN D) 2000 UNITS CAPS Take 1 capsule by mouth daily.  . enalapril (VASOTEC) 10 MG tablet TAKE 1 TABLET EVERY DAY  . Omega-3 Fatty Acids (FISH OIL) 1000 MG CAPS Take 2 capsules by mouth daily.    . rosuvastatin (CRESTOR) 10 MG tablet Take 1 tablet (10 mg total) by mouth daily.  . valACYclovir (VALTREX) 1000 MG tablet TAKE 1 TABLET EVERY DAY AS DIRECTED  . sildenafil (VIAGRA) 100 MG tablet Take 0.5-1 tablets (50-100 mg total) by mouth daily as needed for erectile dysfunction.    Review of Systems  Constitutional: Negative.   HENT: Negative.   Eyes: Negative.   Respiratory: Negative.   Cardiovascular: Negative.   Gastrointestinal: Negative.   Endocrine: Negative.   Genitourinary: Negative.   Musculoskeletal: Negative.   Skin: Negative.        Mole - left arm  Allergic/Immunologic: Negative.   Neurological: Negative.   Hematological: Negative.   Psychiatric/Behavioral: Negative.        Objective:   Physical Exam  Nursing note and vitals reviewed. Constitutional: He is oriented to person, place, and time. He appears well-developed and well-nourished. No  distress.  HENT:  Head: Normocephalic and atraumatic.  Right Ear: External ear normal.  Left Ear: External ear normal.  Nose: Nose normal.  Mouth/Throat: Oropharynx is clear and moist. No oropharyngeal exudate.  The patient had bilateral ear cerumen. Some of this was removed with a ear curette the remainder was used with ear irrigation. This was predominantly on the left ear canal   Eyes: Conjunctivae and EOM are normal. Pupils are equal, round, and reactive to light. Right eye exhibits no discharge. Left eye exhibits no discharge. No scleral icterus.  Neck: Normal range of motion. Neck supple. No thyromegaly present.  No carotid bruit  Cardiovascular: Normal rate, regular rhythm, normal heart sounds and intact distal pulses.  Exam reveals no gallop and no friction rub.   No murmur heard. At 72 per minute  Pulmonary/Chest: Effort normal and breath sounds normal. No respiratory distress. He has no wheezes. He has no rales. He exhibits no tenderness.  Abdominal: Soft. Bowel sounds are normal. He exhibits no mass. There is no tenderness. There is no rebound and no guarding.  Musculoskeletal: Normal range of motion. He exhibits no edema and no tenderness.  Lymphadenopathy:    He has no cervical adenopathy.  Neurological: He is alert and oriented to person, place, and time. He has normal reflexes. No cranial nerve deficit.  Skin: Skin is warm and   dry. No rash noted. No erythema. No pallor.  There is an irritated skin lesion below the left axilla. A shave excision is performed to remove this. The lesion will be sent out for pathology . Monsel solution was used to hyfrecate the base. The patient tolerated the procedure without problems.  Psychiatric: He has a normal mood and affect. His behavior is normal. Judgment and thought content normal.   BP 122/77  Pulse 63  Temp(Src) 97.7 F (36.5 C) (Oral)  Ht 6' (1.829 m)  Wt 248 lb (112.492 kg)  BMI 33.63 kg/m2  Additional cerumen was removed from  the left ear canal with irrigation.       Assessment & Plan:  1. Essential hypertension - POCT CBC; Future - Hepatic function panel; Future - BMP8+EGFR; Future  2. Hyperlipidemia - POCT CBC; Future - NMR, lipoprofile; Future  3. Vitamin D deficiency - POCT CBC; Future - Vit D  25 hydroxy (rtn osteoporosis monitoring); Future  4. Left ear impacted cerumen -This was removed with a curet and with additional irrigation  5. Irritated nevus of axilla -Excision of this was done during the office visit today  Meds ordered this encounter  Medications  . rosuvastatin (CRESTOR) 20 MG tablet    Sig: Take 1 tablet (20 mg total) by mouth daily.    Dispense:  90 tablet    Refill:  3   Patient Instructions  Continue current medications. Continue good therapeutic lifestyle changes which include good diet and exercise. Fall precautions discussed with patient. If an FOBT was given today- please return it to our front desk. If you are over 69 years old - you may need Prevnar 42 or the adult Pneumonia vaccine.  Flu Shots will be available at our office starting mid- September. Please call and schedule a FLU CLINIC APPOINTMENT.   Debrox eardrops over-the-counter can be used to help soften ear wax, 3-4 drops nightly for 3 nights wait 1 week and repeat Keep the lesion site cleaned with alcohol for a few days and a Band-Aid that allows the excision site to have air to heal Continue to try to use a coupon for your Crestor prescription to help save money Continue to drink plenty of fluids   Arrie Senate MD

## 2014-05-03 LAB — PATHOLOGY

## 2014-05-23 ENCOUNTER — Other Ambulatory Visit (INDEPENDENT_AMBULATORY_CARE_PROVIDER_SITE_OTHER): Payer: BC Managed Care – PPO

## 2014-05-23 DIAGNOSIS — E785 Hyperlipidemia, unspecified: Secondary | ICD-10-CM

## 2014-05-23 DIAGNOSIS — E559 Vitamin D deficiency, unspecified: Secondary | ICD-10-CM

## 2014-05-23 DIAGNOSIS — I10 Essential (primary) hypertension: Secondary | ICD-10-CM

## 2014-05-23 LAB — POCT CBC
Granulocyte percent: 74.6 %G (ref 37–80)
HCT, POC: 46.1 % (ref 43.5–53.7)
Hemoglobin: 15.3 g/dL (ref 14.1–18.1)
Lymph, poc: 1.2 (ref 0.6–3.4)
MCH, POC: 29.4 pg (ref 27–31.2)
MCHC: 33.3 g/dL (ref 31.8–35.4)
MCV: 88.4 fL (ref 80–97)
MPV: 8.6 fL (ref 0–99.8)
POC Granulocyte: 4.3 (ref 2–6.9)
POC LYMPH PERCENT: 21.6 %L (ref 10–50)
Platelet Count, POC: 167 10*3/uL (ref 142–424)
RBC: 5.2 M/uL (ref 4.69–6.13)
RDW, POC: 13.1 %
WBC: 5.7 10*3/uL (ref 4.6–10.2)

## 2014-05-23 NOTE — Progress Notes (Signed)
LAB ONLY 

## 2014-05-24 LAB — BMP8+EGFR
BUN/Creatinine Ratio: 14 (ref 9–20)
BUN: 12 mg/dL (ref 6–24)
CO2: 25 mmol/L (ref 18–29)
Calcium: 9.2 mg/dL (ref 8.7–10.2)
Chloride: 102 mmol/L (ref 97–108)
Creatinine, Ser: 0.88 mg/dL (ref 0.76–1.27)
GFR calc Af Amer: 112 mL/min/{1.73_m2} (ref >59)
GFR calc non Af Amer: 97 mL/min/{1.73_m2} (ref >59)
Glucose: 111 mg/dL — ABNORMAL HIGH (ref 65–99)
Potassium: 5.1 mmol/L (ref 3.5–5.2)
Sodium: 142 mmol/L (ref 134–144)

## 2014-05-24 LAB — NMR, LIPOPROFILE
Cholesterol: 133 mg/dL (ref 100–199)
HDL Cholesterol by NMR: 41 mg/dL (ref >39)
HDL Particle Number: 37.7 umol/L (ref >=30.5)
LDL Particle Number: 968 nmol/L (ref <1000)
LDL Size: 20.2 nm (ref >20.5)
LDLC SERPL CALC-MCNC: 66 mg/dL (ref 0–99)
LP-IR Score: 84 — ABNORMAL HIGH (ref <=45)
Small LDL Particle Number: 577 nmol/L — ABNORMAL HIGH (ref <=527)
Triglycerides by NMR: 131 mg/dL (ref 0–149)

## 2014-05-24 LAB — HEPATIC FUNCTION PANEL
ALT: 15 IU/L (ref 0–44)
AST: 18 IU/L (ref 0–40)
Albumin: 4.4 g/dL (ref 3.5–5.5)
Alkaline Phosphatase: 80 IU/L (ref 39–117)
Bilirubin, Direct: 0.14 mg/dL (ref 0.00–0.40)
Total Bilirubin: 0.5 mg/dL (ref 0.0–1.2)
Total Protein: 6.7 g/dL (ref 6.0–8.5)

## 2014-05-24 LAB — VITAMIN D 25 HYDROXY (VIT D DEFICIENCY, FRACTURES): Vit D, 25-Hydroxy: 38.8 ng/mL (ref 30.0–100.0)

## 2014-08-15 ENCOUNTER — Emergency Department
Admission: EM | Admit: 2014-08-15 | Discharge: 2014-08-15 | Disposition: A | Discharge status: Home / Self Care | Payer: BC Managed Care – PPO | Source: Home / Self Care | Attending: Family Medicine | Admitting: Family Medicine

## 2014-08-15 ENCOUNTER — Encounter: Payer: Self-pay | Admitting: *Deleted

## 2014-08-15 DIAGNOSIS — J029 Acute pharyngitis, unspecified: Secondary | ICD-10-CM

## 2014-08-15 LAB — POCT RAPID STREP A (OFFICE): Rapid Strep A Screen: NEGATIVE

## 2014-08-15 MED ORDER — BENZONATATE 200 MG PO CAPS: 200.0000 mg | ORAL_CAPSULE | Freq: Every day | ORAL | Status: DC

## 2014-08-15 MED ORDER — AZITHROMYCIN 250 MG PO TABS: ORAL_TABLET | ORAL | Status: DC

## 2014-08-15 NOTE — ED Notes (Signed)
Pt c/o sore throat and chest tightness x 2 days. Denies fever.

## 2014-08-15 NOTE — Discharge Instructions (Signed)
Take plain Mucinex (1200 mg guaifenesin) twice daily for cough and congestion.  May add Sudafed for sinus congestion.   Increase fluid intake, rest. May use Afrin nasal spray (or generic oxymetazoline) twice daily for about 5 days.  Also recommend using saline nasal spray several times daily and saline nasal irrigation (AYR is a common brand) Try warm salt water gargles for sore throat.  Stop all antihistamines for now, and other non-prescription cough/cold preparations. May take Ibuprofen 200mg , 4 tabs every 8 hours with food for sore throat, headache, etc. Begin Azithromycin if not improving about one week or if persistent fever develops   Follow-up with family doctor if not improving about 10 days.

## 2014-08-15 NOTE — ED Provider Notes (Signed)
CSN: [id number]     Arrival date & time 08/15/14  1541 History   First MD Initiated Contact with Patient 08/15/14 1556     Chief Complaint  Patient presents with  . Sore Throat      HPI Comments: Two days ago patient developed sore throat, mild nasal congestion, fatigue, and minimal cough.  The history is provided by the patient.    Past Medical History  Diagnosis Date  . Fever blister   . Other and unspecified hyperlipidemia   . Villous adenoma   . Wax in ear   . Hypertension    Past Surgical History  Procedure Laterality Date  . Appendectomy    . Shoulder surgery Left y-3   Family History  Problem Relation Age of Onset  . Dementia Mother   . Thyroid disease Father    History  Substance Use Topics  . Smoking status: Never Smoker   . Smokeless tobacco: Not on file  . Alcohol Use: No    Review of Systems + sore throat + minimal cough No pleuritic pain No wheezing + nasal congestion + post-nasal drainage No sinus pain/pressure No itchy/red eyes No earache No hemoptysis No SOB No fever/chills No nausea No vomiting No abdominal pain No diarrhea No urinary symptoms No skin rash + fatigue No myalgias No headache Used OTC meds without relief  Allergies  Review of patient's allergies indicates no known allergies.  Home Medications   Prior to Admission medications   Medication Sig Start Date End Date Taking? Authorizing Provider  azithromycin (ZITHROMAX Z-PAK) 250 MG tablet Take 2 tabs today; then begin one tab once daily for 4 more days. (Rx void after 08/23/14) 08/15/14   Kandra Nicolas, MD  benzonatate (TESSALON) 200 MG capsule Take 1 capsule (200 mg total) by mouth at bedtime. Take as needed for cough 08/15/14   Kandra Nicolas, MD  Cholecalciferol (VITAMIN D) 2000 UNITS CAPS Take 1 capsule by mouth daily.    Historical Provider, MD  enalapril (VASOTEC) 10 MG tablet TAKE 1 TABLET EVERY DAY    Chipper Herb, MD  Omega-3 Fatty Acids (FISH OIL) 1000 MG  CAPS Take 2 capsules by mouth daily.      Historical Provider, MD  rosuvastatin (CRESTOR) 20 MG tablet Take 1 tablet (20 mg total) by mouth daily. 05/01/14   Chipper Herb, MD  sildenafil (VIAGRA) 100 MG tablet Take 0.5-1 tablets (50-100 mg total) by mouth daily as needed for erectile dysfunction. 05/30/13   Chipper Herb, MD  valACYclovir (VALTREX) 1000 MG tablet TAKE 1 TABLET EVERY DAY AS DIRECTED 09/18/13   Chipper Herb, MD   BP 120/76 mmHg  Pulse 83  Temp(Src) 97.8 F (36.6 C) (Oral)  Resp 16  Ht 5' (1.524 m)  Wt 256 lb (116.121 kg)  BMI 50.00 kg/m2  SpO2 97% Physical Exam Nursing notes and Vital Signs reviewed. Appearance:  Patient appears stated age, and in no acute distress.  Patient is obese (BMI 50.0) Eyes:  Pupils are equal, round, and reactive to light and accomodation.  Extraocular movement is intact.  Conjunctivae are not inflamed  Ears:  Canals normal.  Tympanic membranes normal.  Nose:  Mildly congested turbinates.  No sinus tenderness.  Pharynx:  Mild swelling/erythema of uvula, otherwise normal. Neck:  Supple.   Enlarged and mildly tender posterior nodes are palpated bilaterally  Lungs:  Clear to auscultation.  Breath sounds are equal.  Heart:  Regular rate and rhythm without murmurs, rubs,  or gallops.  Abdomen:  Nontender without masses or hepatosplenomegaly.  Bowel sounds are present.  No CVA or flank tenderness.  Extremities:  No edema.  No calf tenderness Skin:  No rash present.   ED Course  Procedures  None    Labs Reviewed  STREP A DNA PROBE  POCT RAPID STREP A (OFFICE):  negative      MDM   1. Acute pharyngitis, unspecified pharyngitis type; suspect early viral URI    There is no evidence of bacterial infection today.  Throat culture pending. Prescription written for Benzonatate [facility]) to take at bedtime for night-time cough.   Take plain Mucinex (1200 mg guaifenesin) twice daily for cough and congestion.  May add Sudafed for sinus congestion.    Increase fluid intake, rest. May use Afrin nasal spray (or generic oxymetazoline) twice daily for about 5 days.  Also recommend using saline nasal spray several times daily and saline nasal irrigation (AYR is a common brand) Try warm salt water gargles for sore throat.  Stop all antihistamines for now, and other non-prescription cough/cold preparations. May take Ibuprofen 200mg , 4 tabs every 8 hours with food for sore throat, headache, etc. Begin Azithromycin if not improving about one week or if persistent fever develops (Given a prescription to hold, with an expiration date)  Follow-up with family doctor if not improving about 10 days.     Kandra Nicolas, MD 08/17/14 [phone]

## 2014-08-16 LAB — STREP A DNA PROBE: GASP: NEGATIVE

## 2014-08-18 ENCOUNTER — Telehealth: Payer: Self-pay | Admitting: Emergency Medicine

## 2014-08-18 NOTE — ED Notes (Signed)
Inquired about patient's status; encourage them to call with questions/concerns.  

## 2014-08-22 ENCOUNTER — Telehealth: Payer: Self-pay | Admitting: Emergency Medicine

## 2014-08-29 ENCOUNTER — Other Ambulatory Visit: Payer: Self-pay | Admitting: Family Medicine

## 2014-08-29 NOTE — Telephone Encounter (Signed)
Patient has follow up appointment with Dr. Laurance Flatten 09/13/14

## 2014-09-13 ENCOUNTER — Ambulatory Visit (INDEPENDENT_AMBULATORY_CARE_PROVIDER_SITE_OTHER): Payer: BLUE CROSS/BLUE SHIELD | Admitting: Family Medicine

## 2014-09-13 ENCOUNTER — Encounter: Payer: Self-pay | Admitting: Family Medicine

## 2014-09-13 VITALS — BP 125/83 | HR 86 | Temp 97.2°F | Ht 60.0 in | Wt 256.0 lb

## 2014-09-13 DIAGNOSIS — E559 Vitamin D deficiency, unspecified: Secondary | ICD-10-CM

## 2014-09-13 DIAGNOSIS — I10 Essential (primary) hypertension: Secondary | ICD-10-CM

## 2014-09-13 DIAGNOSIS — N400 Enlarged prostate without lower urinary tract symptoms: Secondary | ICD-10-CM

## 2014-09-13 DIAGNOSIS — R05 Cough: Secondary | ICD-10-CM

## 2014-09-13 DIAGNOSIS — R059 Cough, unspecified: Secondary | ICD-10-CM

## 2014-09-13 DIAGNOSIS — Z23 Encounter for immunization: Secondary | ICD-10-CM

## 2014-09-13 NOTE — Progress Notes (Signed)
Subjective:    Patient ID: Jeffrey Barnett, male    DOB: August 11, 1961, 54 y.o.   MRN: [id number]  HPI Pt here for follow up and management of chronic medical problems which includes hypertension. The patient also has hyperlipidemia and vitamin D deficiency. He is currently taking his vitamin D at 2000 daily, his enalapril at 10 mg a day and his Crestor at 20 mg daily. He takes Valtrex for his fever blisters when they occur and uses Viagra as needed for erectile dysfunction. The patient complains of a lingering cough. This is worse at nighttime. He mentions that sometimes before Christmas the had good tone in urgent care and took a Z-Pak.         Patient Active Problem List   Diagnosis Date Noted  . Hypertension 04/03/2013  . Fever blister, history of   . Hyperlipidemia   . Villous adenoma, history of    Outpatient Encounter Prescriptions as of 09/13/2014  Medication Sig  . Cholecalciferol (VITAMIN D) 2000 UNITS CAPS Take 1 capsule by mouth daily.  . enalapril (VASOTEC) 10 MG tablet TAKE 1 TABLET EVERY DAY  . Omega-3 Fatty Acids (FISH OIL) 1000 MG CAPS Take 2 capsules by mouth daily.    . rosuvastatin (CRESTOR) 20 MG tablet Take 1 tablet (20 mg total) by mouth daily.  . valACYclovir (VALTREX) 1000 MG tablet TAKE 1 TABLET EVERY DAY AS DIRECTED  . sildenafil (VIAGRA) 100 MG tablet Take 0.5-1 tablets (50-100 mg total) by mouth daily as needed for erectile dysfunction.  . [DISCONTINUED] azithromycin (ZITHROMAX Z-PAK) 250 MG tablet Take 2 tabs today; then begin one tab once daily for 4 more days. (Rx void after 08/23/14)  . [DISCONTINUED] benzonatate (TESSALON) 200 MG capsule Take 1 capsule (200 mg total) by mouth at bedtime. Take as needed for cough    Review of Systems  Constitutional: Negative.   HENT: Negative.   Eyes: Negative.   Respiratory: Positive for cough (worse at night).   Cardiovascular: Negative.   Gastrointestinal: Negative.   Endocrine: Negative.   Genitourinary:  Negative.   Musculoskeletal: Negative.   Skin: Negative.   Allergic/Immunologic: Negative.   Neurological: Negative.   Hematological: Negative.   Psychiatric/Behavioral: Negative.        Objective:   Physical Exam  Constitutional: He is oriented to person, place, and time. He appears well-developed and well-nourished. No distress.  The patient is pleasant and calm  HENT:  Head: Normocephalic and atraumatic.  Right Ear: External ear normal.  Left Ear: External ear normal.  Mouth/Throat: Oropharynx is clear and moist. No oropharyngeal exudate.  There is definite nasal congestion and turbinate swelling bilaterally.  Eyes: Conjunctivae and EOM are normal. Pupils are equal, round, and reactive to light. Right eye exhibits no discharge. Left eye exhibits no discharge. No scleral icterus.  Neck: Normal range of motion. Neck supple. No tracheal deviation present. No thyromegaly present.  There are no anterior cervical nodes or carotid bruits  Cardiovascular: Normal rate, regular rhythm, normal heart sounds and intact distal pulses.   No murmur heard. The heart is regular at 72/m  Pulmonary/Chest: Effort normal and breath sounds normal. No respiratory distress. He has no wheezes. He has no rales. He exhibits no tenderness.  The patient has a dry cough without rales rhonchi or wheezes  Abdominal: Soft. Bowel sounds are normal. He exhibits no mass. There is no tenderness. There is no rebound and no guarding.  The abdomen is obese with out tenderness organ enlargement or  masses  Musculoskeletal: Normal range of motion. He exhibits no edema or tenderness.  Lymphadenopathy:    He has no cervical adenopathy.  Neurological: He is alert and oriented to person, place, and time. He has normal reflexes. No cranial nerve deficit.  Skin: Skin is warm and dry. No rash noted. No erythema. No pallor.  Psychiatric: He has a normal mood and affect. His behavior is normal. Judgment and thought content normal.   Nursing note and vitals reviewed.  BP 125/83 mmHg  Pulse 86  Temp(Src) 97.2 F (36.2 C) (Oral)  Ht 5' (1.524 m)  Wt 256 lb (116.121 kg)  BMI 50.00 kg/m2        Assessment & Plan:  1. Essential hypertension -Continue current medication - POCT CBC; Future - BMP8+EGFR; Future - Hepatic function panel; Future - NMR, lipoprofile; Future  2. Vitamin D deficiency -Continue vitamin D - POCT CBC; Future - Vit D  25 hydroxy (rtn osteoporosis monitoring); Future  3. BPH (benign prostatic hyperplasia) -Continue Viagra as needed - POCT CBC; Future  4. Cough -Use cool mist humidification in the bedroom at night -Drink plenty of fluids -Use saline nose spray frequently -Take Mucinex maximum strength 1 twice daily with a large glass of water for cough and congestion  5. Severe obesity (BMI >= 40) -Continue to work aggressively on weight loss and exercise more and have better diet habits -Please call back and arrange an appointment with the clinical pharmacists to get more help with weight loss  Patient Instructions  Continue current medications. Continue good therapeutic lifestyle changes which include good diet and exercise. Fall precautions discussed with patient. If an FOBT was given today- please return it to our front desk. If you are over 49 years old - you may need Prevnar 76 or the adult Pneumonia vaccine.  Flu Shots will be available at our office starting mid- September. Please call and schedule a FLU CLINIC APPOINTMENT.   Continue to take Crestor Continue to follow aggressive therapeutic lifestyle changes which include diet and exercise Continue to take enalapril for blood pressure Because of the lingering cough take Mucinex maximum strength, blue and white in color, 1 twice daily with a large glass of water Drink plenty of water Keep the house cooler and use a cool mist humidifier in her bedroom at nighttime Continue to exercise regularly and aggressively work  on losing weight Return the FOBT Return to the office for fasting lab work    Arrie Senate MD

## 2014-09-13 NOTE — Patient Instructions (Addendum)
Continue current medications. Continue good therapeutic lifestyle changes which include good diet and exercise. Fall precautions discussed with patient. If an FOBT was given today- please return it to our front desk. If you are over 56 years old - you may need Prevnar 19 or the adult Pneumonia vaccine.  Flu Shots will be available at our office starting mid- September. Please call and schedule a FLU CLINIC APPOINTMENT.   Continue to take Crestor Continue to follow aggressive therapeutic lifestyle changes which include diet and exercise Continue to take enalapril for blood pressure Because of the lingering cough take Mucinex maximum strength, blue and white in color, 1 twice daily with a large glass of water Drink plenty of water Keep the house cooler and use a cool mist humidifier in her bedroom at nighttime Continue to exercise regularly and aggressively work on losing weight Return the FOBT Return to the office for fasting lab work

## 2014-09-29 ENCOUNTER — Other Ambulatory Visit: Payer: Self-pay | Admitting: Family Medicine

## 2014-10-02 ENCOUNTER — Other Ambulatory Visit (INDEPENDENT_AMBULATORY_CARE_PROVIDER_SITE_OTHER): Payer: BLUE CROSS/BLUE SHIELD

## 2014-10-02 DIAGNOSIS — N400 Enlarged prostate without lower urinary tract symptoms: Secondary | ICD-10-CM

## 2014-10-02 DIAGNOSIS — I10 Essential (primary) hypertension: Secondary | ICD-10-CM

## 2014-10-02 DIAGNOSIS — E559 Vitamin D deficiency, unspecified: Secondary | ICD-10-CM

## 2014-10-02 LAB — POCT CBC
Granulocyte percent: 72 %G (ref 37–80)
HCT, POC: 48 % (ref 43.5–53.7)
Hemoglobin: 15.1 g/dL (ref 14.1–18.1)
Lymph, poc: 1.8 (ref 0.6–3.4)
MCH, POC: 27.3 pg (ref 27–31.2)
MCHC: 31.4 g/dL — AB (ref 31.8–35.4)
MCV: 86.8 fL (ref 80–97)
MPV: 8.3 fL (ref 0–99.8)
POC Granulocyte: 5 (ref 2–6.9)
POC LYMPH PERCENT: 26.1 %L (ref 10–50)
Platelet Count, POC: 212 10*3/uL (ref 142–424)
RBC: 5.5 M/uL (ref 4.69–6.13)
RDW, POC: 13.4 %
WBC: 6.9 10*3/uL (ref 4.6–10.2)

## 2014-10-02 NOTE — Progress Notes (Signed)
Lab only 

## 2014-10-03 LAB — HEPATIC FUNCTION PANEL
ALT: 25 IU/L (ref 0–44)
AST: 28 IU/L (ref 0–40)
Albumin: 4.4 g/dL (ref 3.5–5.5)
Alkaline Phosphatase: 78 IU/L (ref 39–117)
Bilirubin, Direct: 0.18 mg/dL (ref 0.00–0.40)
Total Bilirubin: 0.6 mg/dL (ref 0.0–1.2)
Total Protein: 6.9 g/dL (ref 6.0–8.5)

## 2014-10-03 LAB — NMR, LIPOPROFILE
Cholesterol: 130 mg/dL (ref 100–199)
HDL Cholesterol by NMR: 44 mg/dL (ref >39)
HDL Particle Number: 36.4 umol/L (ref >=30.5)
LDL Particle Number: 874 nmol/L (ref <1000)
LDL Size: 20.3 nm (ref >20.5)
LDL-C: 66 mg/dL (ref 0–99)
LP-IR Score: 75 — ABNORMAL HIGH (ref <=45)
Small LDL Particle Number: 432 nmol/L (ref <=527)
Triglycerides by NMR: 99 mg/dL (ref 0–149)

## 2014-10-03 LAB — BMP8+EGFR
BUN/Creatinine Ratio: 14 (ref 9–20)
BUN: 13 mg/dL (ref 6–24)
CO2: 22 mmol/L (ref 18–29)
Calcium: 8.9 mg/dL (ref 8.7–10.2)
Chloride: 101 mmol/L (ref 97–108)
Creatinine, Ser: 0.96 mg/dL (ref 0.76–1.27)
GFR calc Af Amer: 102 mL/min/{1.73_m2} (ref >59)
GFR calc non Af Amer: 89 mL/min/{1.73_m2} (ref >59)
Glucose: 118 mg/dL — ABNORMAL HIGH (ref 65–99)
Potassium: 4.9 mmol/L (ref 3.5–5.2)
Sodium: 139 mmol/L (ref 134–144)

## 2014-10-03 LAB — VITAMIN D 25 HYDROXY (VIT D DEFICIENCY, FRACTURES): Vit D, 25-Hydroxy: 38.6 ng/mL (ref 30.0–100.0)

## 2014-12-10 ENCOUNTER — Encounter: Payer: Self-pay | Admitting: Family Medicine

## 2014-12-10 ENCOUNTER — Other Ambulatory Visit: Payer: Self-pay | Admitting: *Deleted

## 2014-12-10 MED ORDER — SILDENAFIL CITRATE 20 MG PO TABS: ORAL_TABLET | ORAL | Status: DC

## 2015-01-15 ENCOUNTER — Ambulatory Visit: Payer: BLUE CROSS/BLUE SHIELD | Admitting: Family Medicine

## 2015-03-05 ENCOUNTER — Ambulatory Visit: Payer: BLUE CROSS/BLUE SHIELD | Admitting: Family Medicine

## 2015-03-19 ENCOUNTER — Encounter: Payer: Self-pay | Admitting: Family Medicine

## 2015-03-19 ENCOUNTER — Ambulatory Visit (INDEPENDENT_AMBULATORY_CARE_PROVIDER_SITE_OTHER): Payer: BLUE CROSS/BLUE SHIELD | Admitting: Family Medicine

## 2015-03-19 VITALS — BP 127/88 | HR 79 | Temp 98.4°F | Ht 72.0 in | Wt 255.0 lb

## 2015-03-19 DIAGNOSIS — N400 Enlarged prostate without lower urinary tract symptoms: Secondary | ICD-10-CM | POA: Diagnosis not present

## 2015-03-19 DIAGNOSIS — Z0000 Encounter for general adult medical examination without abnormal findings: Secondary | ICD-10-CM | POA: Diagnosis not present

## 2015-03-19 DIAGNOSIS — E785 Hyperlipidemia, unspecified: Secondary | ICD-10-CM

## 2015-03-19 DIAGNOSIS — I10 Essential (primary) hypertension: Secondary | ICD-10-CM | POA: Diagnosis not present

## 2015-03-19 DIAGNOSIS — E559 Vitamin D deficiency, unspecified: Secondary | ICD-10-CM

## 2015-03-19 LAB — POCT CBC
Granulocyte percent: 74.1 %G (ref 37–80)
HCT, POC: 46.6 % (ref 43.5–53.7)
Hemoglobin: 15.2 g/dL (ref 14.1–18.1)
Lymph, poc: 1.8 (ref 0.6–3.4)
MCH, POC: 27.8 pg (ref 27–31.2)
MCHC: 32.6 g/dL (ref 31.8–35.4)
MCV: 85.2 fL (ref 80–97)
MPV: 8.1 fL (ref 0–99.8)
POC Granulocyte: 5.6 (ref 2–6.9)
POC LYMPH PERCENT: 23.9 %L (ref 10–50)
Platelet Count, POC: 232 10*3/uL (ref 142–424)
RBC: 5.47 M/uL (ref 4.69–6.13)
RDW, POC: 13.2 %
WBC: 7.5 10*3/uL (ref 4.6–10.2)

## 2015-03-19 NOTE — Patient Instructions (Addendum)
Continue current medications. Continue good therapeutic lifestyle changes which include good diet and exercise. Fall precautions discussed with patient. If an FOBT was given today- please return it to our front desk. If you are over 56 years old - you may need Prevnar 29 or the adult Pneumonia vaccine.  After your visit with [place] today you will receive a survey in the mail or online from [employer] regarding your care with [place]. Please take a moment to fill this out. Your feedback is very important to [place] as you can help [place] better understand your patient needs as well as improve your experience and satisfaction. WE CARE ABOUT YOU!!!   Continue current medications Continue to work on diet and exercise and try to lose weight This summer drink plenty of fluids Do not forget to get your eye exam Check with your insurance regarding the Prevnar vaccine Return the FOBT We will call you with your lab work results once those results become available Follow-up with orthopedic surgeon if right shoulder problems

## 2015-03-19 NOTE — Progress Notes (Signed)
Subjective:    Patient ID: Jeffrey Barnett, male    DOB: 05-21-1961, 54 y.o.   MRN: [id number]  HPI 54 year old male comes in today for follow up on chronic medical conditions which include hypertension and hyperlipidemia. The patient does complain today of some right shoulder pain. The patient is seeing Dr. Laurina Bustle the orthopedist for his right shoulder and he is not at the point of doing anything aggressive with this at this time. He is also due to get an eye exam. He denies chest pain shortness of breath trouble swallowing and only has occasional heartburn secondary to his diet. He is seen no blood in the stool had black tarry bowel movements. His passing his water without problems. He'll get his lab work done today including his PSA. He will also return an FOBT.   Patient Active Problem List   Diagnosis Date Noted  . Hypertension 04/03/2013  . Fever blister, history of   . Hyperlipidemia   . Villous adenoma, history of    Outpatient Encounter Prescriptions as of 03/19/2015  Medication Sig  . Cholecalciferol (VITAMIN D) 2000 UNITS CAPS Take 1 capsule by mouth daily.  . enalapril (VASOTEC) 10 MG tablet TAKE 1 TABLET EVERY DAY  . Omega-3 Fatty Acids (FISH OIL) 1000 MG CAPS Take 2 capsules by mouth daily.    . rosuvastatin (CRESTOR) 20 MG tablet Take 1 tablet (20 mg total) by mouth daily.  . sildenafil (REVATIO) 20 MG tablet Take 2-5 tabs as needed prior to sexual activity.  . valACYclovir (VALTREX) 1000 MG tablet TAKE 1 TABLET EVERY DAY AS DIRECTED   No facility-administered encounter medications on file as of 03/19/2015.       Review of Systems  Constitutional: Negative.   HENT: Negative.   Eyes: Negative.   Respiratory: Negative.   Cardiovascular: Negative.   Gastrointestinal: Negative.   Endocrine: Negative.   Genitourinary: Negative.   Musculoskeletal: Positive for arthralgias (right shoulder pain).  Skin: Negative.   Allergic/Immunologic: Negative.   Neurological: Negative.    Hematological: Negative.   Psychiatric/Behavioral: Negative.        Objective:   Physical Exam  Constitutional: He is oriented to person, place, and time. He appears well-developed and well-nourished. No distress.  HENT:  Head: Normocephalic and atraumatic.  Right Ear: External ear normal.  Left Ear: External ear normal.  Mouth/Throat: Oropharynx is clear and moist. No oropharyngeal exudate.  Nasal congestion and turbinate swelling bilaterally  Eyes: Conjunctivae and EOM are normal. Pupils are equal, round, and reactive to light. Right eye exhibits no discharge. Left eye exhibits no discharge. No scleral icterus.  Neck: Normal range of motion. Neck supple. No thyromegaly present.  No thyromegaly or anterior cervical adenopathy or carotid bruits  Cardiovascular: Normal rate, regular rhythm, normal heart sounds and intact distal pulses.   No murmur heard. At 72/m  Pulmonary/Chest: Effort normal and breath sounds normal. No respiratory distress. He has no wheezes. He has no rales. He exhibits no tenderness.  Clear anteriorly and posteriorly without axillary adenopathy  Abdominal: Soft. Bowel sounds are normal. He exhibits no mass. There is no tenderness. There is no rebound and no guarding.  No abdominal tenderness masses or organ enlargement or bruits  Genitourinary: Rectum normal and penis normal.  The prostate was enlarged but soft and smooth without lumps or masses. There were no rectal masses. Were no inguinal hernias present. The external genitalia were within normal limits.  Musculoskeletal: Normal range of motion. He exhibits no  edema or tenderness.  Lymphadenopathy:    He has no cervical adenopathy.  Neurological: He is alert and oriented to person, place, and time. He has normal reflexes. No cranial nerve deficit.  Skin: Skin is warm and dry. No rash noted.  Psychiatric: He has a normal mood and affect. His behavior is normal. Judgment and thought content normal.  Nursing  note and vitals reviewed.   BP 127/88 mmHg  Pulse 79  Temp(Src) 98.4 F (36.9 C) (Oral)  Ht 6' (1.829 m)  Wt 255 lb (115.667 kg)  BMI 34.58 kg/m2       Assessment & Plan:  1. Essential hypertension -The blood pressure is good today the patient should continue with current treatment - POCT CBC - Thyroid Panel With TSH  2. Hyperlipidemia -He should continue with current treatment pending results of lab work - Hepatic function panel - BMP8+EGFR - Lipid panel  3. BPH (benign prostatic hypertrophy) -The prostate was enlarged but soft and smooth. He is having no symptoms with voiding. - PSA, total and free  4. Vitamin D deficiency -The patient should continue with his current vitamin D dose pending results of lab work being done today. - Vit D  25 hydroxy (rtn osteoporosis monitoring)  5. Annual physical exam -Health maintenance wise, the patient should check with his insurance regarding the Prevnar vaccine. -He is also due to get an eye exam. -He should return his FOBT  No orders of the defined types were placed in this encounter.   Patient Instructions  Continue current medications. Continue good therapeutic lifestyle changes which include good diet and exercise. Fall precautions discussed with patient. If an FOBT was given today- please return it to our front desk. If you are over 59 years old - you may need Prevnar 61 or the adult Pneumonia vaccine.  After your visit with [place] today you will receive a survey in the mail or online from [employer] regarding your care with [place]. Please take a moment to fill this out. Your feedback is very important to [place] as you can help [place] better understand your patient needs as well as improve your experience and satisfaction. WE CARE ABOUT YOU!!!   Continue current medications Continue to work on diet and exercise and try to lose weight This summer drink plenty of fluids Do not forget to get your eye exam Check with your insurance  regarding the Prevnar vaccine Return the FOBT We will call you with your lab work results once those results become available Follow-up with orthopedic surgeon if right shoulder problems   Arrie Senate MD

## 2015-03-21 ENCOUNTER — Telehealth: Payer: Self-pay | Admitting: *Deleted

## 2015-03-21 LAB — HEPATIC FUNCTION PANEL
ALT: 24 IU/L (ref 0–44)
AST: 24 IU/L (ref 0–40)
Albumin: 4.7 g/dL (ref 3.5–5.5)
Alkaline Phosphatase: 81 IU/L (ref 39–117)
Bilirubin Total: 0.5 mg/dL (ref 0.0–1.2)
Bilirubin, Direct: 0.14 mg/dL (ref 0.00–0.40)
Total Protein: 7.1 g/dL (ref 6.0–8.5)

## 2015-03-21 LAB — BMP8+EGFR
BUN/Creatinine Ratio: 14 (ref 9–20)
BUN: 12 mg/dL (ref 6–24)
CO2: 22 mmol/L (ref 18–29)
Calcium: 9.5 mg/dL (ref 8.7–10.2)
Chloride: 98 mmol/L (ref 97–108)
Creatinine, Ser: 0.85 mg/dL (ref 0.76–1.27)
GFR calc Af Amer: 113 mL/min/{1.73_m2} (ref >59)
GFR calc non Af Amer: 97 mL/min/{1.73_m2} (ref >59)
Glucose: 112 mg/dL — ABNORMAL HIGH (ref 65–99)
Potassium: 4 mmol/L (ref 3.5–5.2)
Sodium: 140 mmol/L (ref 134–144)

## 2015-03-21 LAB — LIPID PANEL
Chol/HDL Ratio: 3.5 ratio units (ref 0.0–5.0)
Cholesterol, Total: 145 mg/dL (ref 100–199)
HDL: 42 mg/dL (ref >39)
LDL Calculated: 64 mg/dL (ref 0–99)
Triglycerides: 197 mg/dL — ABNORMAL HIGH (ref 0–149)
VLDL Cholesterol Cal: 39 mg/dL (ref 5–40)

## 2015-03-21 LAB — THYROID PANEL WITH TSH
Free Thyroxine Index: 2 (ref 1.2–4.9)
T3 Uptake Ratio: 24 % (ref 24–39)
T4, Total: 8.4 ug/dL (ref 4.5–12.0)
TSH: 2.42 u[IU]/mL (ref 0.450–4.500)

## 2015-03-21 LAB — PSA, TOTAL AND FREE
PSA, Free Pct: 20 %
PSA, Free: 0.22 ng/mL
Prostate Specific Ag, Serum: 1.1 ng/mL (ref 0.0–4.0)

## 2015-03-21 LAB — VITAMIN D 25 HYDROXY (VIT D DEFICIENCY, FRACTURES): Vit D, 25-Hydroxy: 35 ng/mL (ref 30.0–100.0)

## 2015-03-21 NOTE — Telephone Encounter (Signed)
-----   Message from Chipper Herb, MD sent at 03/21/2015  7:00 AM EDT ----- The vitamin D level is within normal limits. Please make sure the patient is taking his vitamin D3 regularly All liver function tests are within normal limits The blood sugar slightly elevated at 112. The creatinine, the most important kidney function test is within normal limits the electrolytes including potassium are good. The PSA is low and within normal limits---LAB--- no serial PSAs+++++++ All thyroid function tests are within normal limits Cholesterol numbers with traditional lipid testing have a total LDL C that is good and at goal at 64. The HDL is within normal limits. The triglycerides are elevated, however, the patient was not fasting.------ the patient should continue with his current cholesterol medicine and he needs to continue to exercise regularly and watch his diet as closely as possible

## 2015-03-21 NOTE — Telephone Encounter (Signed)
Pt notified of lab results

## 2015-03-26 ENCOUNTER — Other Ambulatory Visit: Payer: BLUE CROSS/BLUE SHIELD

## 2015-03-26 DIAGNOSIS — Z1212 Encounter for screening for malignant neoplasm of rectum: Secondary | ICD-10-CM

## 2015-03-28 LAB — FECAL OCCULT BLOOD, IMMUNOCHEMICAL: Fecal Occult Bld: NEGATIVE

## 2015-04-02 ENCOUNTER — Other Ambulatory Visit: Payer: Self-pay | Admitting: Family Medicine

## 2015-05-03 ENCOUNTER — Other Ambulatory Visit: Payer: Self-pay | Admitting: *Deleted

## 2015-05-03 ENCOUNTER — Encounter: Payer: Self-pay | Admitting: Family Medicine

## 2015-05-03 MED ORDER — ROSUVASTATIN CALCIUM 20 MG PO TABS: 20.0000 mg | ORAL_TABLET | Freq: Every day | ORAL | Status: DC

## 2015-07-22 ENCOUNTER — Ambulatory Visit: Payer: BLUE CROSS/BLUE SHIELD | Admitting: Family Medicine

## 2015-07-29 ENCOUNTER — Encounter: Payer: Self-pay | Admitting: Family Medicine

## 2015-07-29 ENCOUNTER — Ambulatory Visit (INDEPENDENT_AMBULATORY_CARE_PROVIDER_SITE_OTHER): Payer: BLUE CROSS/BLUE SHIELD | Admitting: Family Medicine

## 2015-07-29 VITALS — BP 127/80 | HR 67 | Temp 97.1°F | Ht 72.0 in | Wt 259.0 lb

## 2015-07-29 DIAGNOSIS — E785 Hyperlipidemia, unspecified: Secondary | ICD-10-CM | POA: Diagnosis not present

## 2015-07-29 DIAGNOSIS — R202 Paresthesia of skin: Secondary | ICD-10-CM | POA: Diagnosis not present

## 2015-07-29 DIAGNOSIS — E559 Vitamin D deficiency, unspecified: Secondary | ICD-10-CM | POA: Diagnosis not present

## 2015-07-29 DIAGNOSIS — N400 Enlarged prostate without lower urinary tract symptoms: Secondary | ICD-10-CM | POA: Diagnosis not present

## 2015-07-29 DIAGNOSIS — I10 Essential (primary) hypertension: Secondary | ICD-10-CM | POA: Diagnosis not present

## 2015-07-29 DIAGNOSIS — R200 Anesthesia of skin: Secondary | ICD-10-CM

## 2015-07-29 MED ORDER — VALACYCLOVIR HCL 1 G PO TABS: ORAL_TABLET | ORAL | Status: DC

## 2015-07-29 NOTE — Patient Instructions (Addendum)
Continue current medications. Continue good therapeutic lifestyle changes which include good diet and exercise. Fall precautions discussed with patient. If an FOBT was given today- please return it to our front desk. If you are over 56 years old - you may need Prevnar 74 or the adult Pneumonia vaccine.  **Flu shots are available--- please call and schedule a FLU-CLINIC appointment**  After your visit with [place] today you will receive a survey in the mail or online from [employer] regarding your care with [place]. Please take a moment to fill this out. Your feedback is very important to [place] as you can help [place] better understand your patient needs as well as improve your experience and satisfaction. WE CARE ABOUT YOU!!!    Stay active, drink plenty of fluids and stay well hydrated this winter Try to exercise more and watch the diet more closely Use Debrox eardrops, 3 or 4 drops into the affected ear nightly for 3 nights wait 1 week and repeat-this should soften the ear wax that would come out more easily Don't forget to wear the wrist brace nightly on the right hand and if the problem with numbness and tingling continue get back in touch with [place] and we will schedule for peripheral nerve conduction velocity testing Check with your insurance about the shingles shot

## 2015-07-29 NOTE — Progress Notes (Signed)
Subjective:    Patient ID: Jeffrey Barnett, male    DOB: 06-23-1961, 54 y.o.   MRN: [id number]  HPI  Pt here for follow up and management of chronic medical problems which includes hypertension and hyperlipidemia. He is taking medications regularly. The patient has noticed some tingling in his right hand the first 3 fingers and this is been over the past few days. He is right-handed. He is having problems with this in the past. He denies chest pain shortness of breath trouble swallowing, only has occasional heartburn, no blood in the stool or black tarry bowel movements. He is passing his water without problems. Because of his work schedule he gets very little time to exercise. He is not watching his diet as closely as he should.       Patient Active Problem List   Diagnosis Date Noted  . Hypertension 04/03/2013  . Fever blister, history of   . Hyperlipidemia   . Villous adenoma, history of    Outpatient Encounter Prescriptions as of 07/29/2015  Medication Sig  . Cholecalciferol (VITAMIN D) 2000 UNITS CAPS Take 1 capsule by mouth daily.  . enalapril (VASOTEC) 10 MG tablet TAKE 1 TABLET EVERY DAY  . Omega-3 Fatty Acids (FISH OIL) 1000 MG CAPS Take 2 capsules by mouth daily.    . rosuvastatin (CRESTOR) 20 MG tablet Take 1 tablet (20 mg total) by mouth daily.  . sildenafil (REVATIO) 20 MG tablet Take 2-5 tabs as needed prior to sexual activity.  . valACYclovir (VALTREX) 1000 MG tablet TAKE 1 TABLET EVERY DAY AS DIRECTED  . [DISCONTINUED] valACYclovir (VALTREX) 1000 MG tablet TAKE 1 TABLET EVERY DAY AS DIRECTED   No facility-administered encounter medications on file as of 07/29/2015.      Review of Systems  Constitutional: Negative.   HENT: Negative.   Eyes: Negative.   Respiratory: Negative.   Cardiovascular: Negative.   Gastrointestinal: Negative.   Endocrine: Negative.   Genitourinary: Negative.   Musculoskeletal: Negative.   Skin: Negative.   Allergic/Immunologic:  Negative.   Neurological: Positive for numbness (of fingers on right hand).  Hematological: Negative.   Psychiatric/Behavioral: Negative.        Objective:   Physical Exam  Constitutional: He is oriented to person, place, and time. He appears well-developed and well-nourished. No distress.  HENT:  Head: Normocephalic and atraumatic.  Right Ear: External ear normal.  Left Ear: External ear normal.  Nose: Nose normal.  Mouth/Throat: Oropharynx is clear and moist. No oropharyngeal exudate.  Eyes: Conjunctivae and EOM are normal. Pupils are equal, round, and reactive to light. Right eye exhibits no discharge. Left eye exhibits no discharge. No scleral icterus.  Neck: Normal range of motion. Neck supple. No thyromegaly present.  No carotid bruits thyromegaly or anterior cervical adenopathy  Cardiovascular: Normal rate, regular rhythm, normal heart sounds and intact distal pulses.   No murmur heard. At 72/m with a regular rate and rhythm  Pulmonary/Chest: Effort normal and breath sounds normal. No respiratory distress. He has no wheezes. He has no rales. He exhibits no tenderness.  Clear anteriorly and posteriorly and no axillary adenopathy  Abdominal: Soft. Bowel sounds are normal. He exhibits no mass. There is no tenderness. There is no rebound and no guarding.  No epigastric tenderness organ enlargement or masses. No inguinal adenopathy.  Musculoskeletal: Normal range of motion. He exhibits no edema.  Lymphadenopathy:    He has no cervical adenopathy.  Neurological: He is alert and oriented to person, place, and  time. He has normal reflexes. No cranial nerve deficit.  Skin: Skin is warm and dry. No rash noted.  Psychiatric: He has a normal mood and affect. His behavior is normal. Judgment and thought content normal.  Nursing note and vitals reviewed.  BP 127/80 mmHg  Pulse 67  Temp(Src) 97.1 F (36.2 C) (Oral)  Ht 6' (1.829 m)  Wt 259 lb (117.482 kg)  BMI 35.12 kg/m2          Assessment & Plan:  1. Hyperlipidemia -Continue current treatment pending results of lab work - CBC with Differential/Platelet; Future - NMR, lipoprofile; Future  2. Essential hypertension -Blood pressure is good today and patient will continue with his current blood pressure treatment and watching sodium intake - BMP8+EGFR; Future - CBC with Differential/Platelet; Future - Hepatic function panel; Future  3. BPH (benign prostatic hypertrophy) -No symptoms with voiding - CBC with Differential/Platelet; Future  4. Vitamin D deficiency -Continue current treatment pending results of lab work - CBC with Differential/Platelet; Future - VITAMIN D 25 Hydroxy (Vit-D Deficiency, Fractures); Future  5. Numbness and tingling in right hand -Wear wrist braces at nighttime and tingling persist get back in touch with [place] for nerve conduction studies  Meds ordered this encounter  Medications  . valACYclovir (VALTREX) 1000 MG tablet    Sig: TAKE 1 TABLET EVERY DAY AS DIRECTED    Dispense:  30 tablet    Refill:  1   Patient Instructions  Continue current medications. Continue good therapeutic lifestyle changes which include good diet and exercise. Fall precautions discussed with patient. If an FOBT was given today- please return it to our front desk. If you are over 42 years old - you may need Prevnar 61 or the adult Pneumonia vaccine.  **Flu shots are available--- please call and schedule a FLU-CLINIC appointment**  After your visit with [place] today you will receive a survey in the mail or online from [employer] regarding your care with [place]. Please take a moment to fill this out. Your feedback is very important to [place] as you can help [place] better understand your patient needs as well as improve your experience and satisfaction. WE CARE ABOUT YOU!!!    Stay active, drink plenty of fluids and stay well hydrated this winter Try to exercise more and watch the diet more closely Use Debrox eardrops, 3  or 4 drops into the affected ear nightly for 3 nights wait 1 week and repeat-this should soften the ear wax that would come out more easily Don't forget to wear the wrist brace nightly on the right hand and if the problem with numbness and tingling continue get back in touch with [place] and we will schedule for peripheral nerve conduction velocity testing Check with your insurance about the shingles shot   Arrie Senate MD

## 2015-08-10 ENCOUNTER — Other Ambulatory Visit: Payer: BLUE CROSS/BLUE SHIELD

## 2015-08-10 DIAGNOSIS — I10 Essential (primary) hypertension: Secondary | ICD-10-CM

## 2015-08-10 DIAGNOSIS — E559 Vitamin D deficiency, unspecified: Secondary | ICD-10-CM

## 2015-08-10 DIAGNOSIS — E785 Hyperlipidemia, unspecified: Secondary | ICD-10-CM

## 2015-08-10 DIAGNOSIS — N400 Enlarged prostate without lower urinary tract symptoms: Secondary | ICD-10-CM

## 2015-08-12 LAB — BMP8+EGFR
BUN/Creatinine Ratio: 16 (ref 9–20)
BUN: 15 mg/dL (ref 6–24)
CO2: 24 mmol/L (ref 18–29)
Calcium: 9.3 mg/dL (ref 8.7–10.2)
Chloride: 101 mmol/L (ref 97–106)
Creatinine, Ser: 0.91 mg/dL (ref 0.76–1.27)
GFR calc Af Amer: 109 mL/min/{1.73_m2} (ref >59)
GFR calc non Af Amer: 94 mL/min/{1.73_m2} (ref >59)
Glucose: 107 mg/dL — ABNORMAL HIGH (ref 65–99)
Potassium: 5 mmol/L (ref 3.5–5.2)
Sodium: 141 mmol/L (ref 136–144)

## 2015-08-12 LAB — CBC WITH DIFFERENTIAL/PLATELET
Basophils Absolute: 0 10*3/uL (ref 0.0–0.2)
Basos: 0 %
EOS (ABSOLUTE): 0.1 10*3/uL (ref 0.0–0.4)
Eos: 1 %
Hematocrit: 44.3 % (ref 37.5–51.0)
Hemoglobin: 15.3 g/dL (ref 12.6–17.7)
Immature Grans (Abs): 0 10*3/uL (ref 0.0–0.1)
Immature Granulocytes: 0 %
Lymphocytes Absolute: 1.3 10*3/uL (ref 0.7–3.1)
Lymphs: 19 %
MCH: 29.7 pg (ref 26.6–33.0)
MCHC: 34.5 g/dL (ref 31.5–35.7)
MCV: 86 fL (ref 79–97)
Monocytes Absolute: 0.6 10*3/uL (ref 0.1–0.9)
Monocytes: 9 %
Neutrophils Absolute: 4.8 10*3/uL (ref 1.4–7.0)
Neutrophils: 71 %
Platelets: 216 10*3/uL (ref 150–379)
RBC: 5.15 x10E6/uL (ref 4.14–5.80)
RDW: 14.2 % (ref 12.3–15.4)
WBC: 6.8 10*3/uL (ref 3.4–10.8)

## 2015-08-12 LAB — HEPATIC FUNCTION PANEL
ALT: 22 IU/L (ref 0–44)
AST: 24 IU/L (ref 0–40)
Albumin: 4.2 g/dL (ref 3.5–5.5)
Alkaline Phosphatase: 79 IU/L (ref 39–117)
Bilirubin Total: 0.6 mg/dL (ref 0.0–1.2)
Bilirubin, Direct: 0.17 mg/dL (ref 0.00–0.40)
Total Protein: 6.8 g/dL (ref 6.0–8.5)

## 2015-08-12 LAB — NMR, LIPOPROFILE
Cholesterol: 127 mg/dL (ref 100–199)
HDL Cholesterol by NMR: 40 mg/dL (ref >39)
HDL Particle Number: 33.3 umol/L (ref >=30.5)
LDL Particle Number: 930 nmol/L (ref <1000)
LDL Size: 20.2 nm (ref >20.5)
LDL-C: 64 mg/dL (ref 0–99)
LP-IR Score: 67 — ABNORMAL HIGH (ref <=45)
Small LDL Particle Number: 574 nmol/L — ABNORMAL HIGH (ref <=527)
Triglycerides by NMR: 115 mg/dL (ref 0–149)

## 2015-08-12 LAB — VITAMIN D 25 HYDROXY (VIT D DEFICIENCY, FRACTURES): Vit D, 25-Hydroxy: 38.1 ng/mL (ref 30.0–100.0)

## 2015-08-14 LAB — HM DIABETES EYE EXAM

## 2015-08-26 ENCOUNTER — Encounter: Payer: Self-pay | Admitting: *Deleted

## 2015-09-24 ENCOUNTER — Ambulatory Visit (INDEPENDENT_AMBULATORY_CARE_PROVIDER_SITE_OTHER): Payer: BLUE CROSS/BLUE SHIELD | Admitting: Pediatrics

## 2015-09-24 ENCOUNTER — Encounter: Payer: Self-pay | Admitting: Pediatrics

## 2015-09-24 VITALS — BP 116/76 | HR 80 | Temp 97.8°F | Ht 72.0 in | Wt 259.4 lb

## 2015-09-24 DIAGNOSIS — J029 Acute pharyngitis, unspecified: Secondary | ICD-10-CM | POA: Diagnosis not present

## 2015-09-24 LAB — POCT RAPID STREP A (OFFICE): Rapid Strep A Screen: NEGATIVE

## 2015-09-24 NOTE — Addendum Note (Signed)
Addended by: Selmer Dominion on: 09/24/2015 04:01 PM   Modules accepted: Orders

## 2015-09-24 NOTE — Progress Notes (Signed)
    Subjective:    Patient ID: Jeffrey Barnett, male    DOB: 01-17-1961, 55 y.o.   MRN: [id number]  CC: Sore Throat   HPI: Jeffrey Barnett is a 55 y.o. male presenting for Sore Throat  No fevers Woke up this morning feeling like razor blades in his throat Yesterday started feeling tired, slightly achey Appetite has been fine No cough, no nasal congestion   Depression screen [facility] 2/9 09/24/2015 07/29/2015 09/13/2014  Decreased Interest 0 0 0  Down, Depressed, Hopeless 0 0 0  PHQ - 2 Score 0 0 0     Relevant past medical, surgical, family and social history reviewed and updated as indicated. Interim medical history since our last visit reviewed. Allergies and medications reviewed and updated.    ROS: Per HPI unless specifically indicated above  History  Smoking status  . Never Smoker   Smokeless tobacco  . Never Used    Past Medical History Patient Active Problem List   Diagnosis Date Noted  . Hypertension 04/03/2013  . Fever blister, history of   . Hyperlipidemia   . Villous adenoma, history of     Current Outpatient Prescriptions  Medication Sig Dispense Refill  . Cholecalciferol (VITAMIN D) 2000 UNITS CAPS Take 1 capsule by mouth daily.    . enalapril (VASOTEC) 10 MG tablet TAKE 1 TABLET EVERY DAY 90 tablet 1  . Omega-3 Fatty Acids (FISH OIL) 1000 MG CAPS Take 2 capsules by mouth daily.      . rosuvastatin (CRESTOR) 20 MG tablet Take 1 tablet (20 mg total) by mouth daily. 90 tablet 3  . sildenafil (REVATIO) 20 MG tablet Take 2-5 tabs as needed prior to sexual activity. 50 tablet 1  . valACYclovir (VALTREX) 1000 MG tablet TAKE 1 TABLET EVERY DAY AS DIRECTED 30 tablet 1   No current facility-administered medications for this visit.       Objective:    BP 116/76 mmHg  Pulse 80  Temp(Src) 97.8 F (36.6 C) (Oral)  Ht 6' (1.829 m)  Wt 259 lb 6.4 oz (117.663 kg)  BMI 35.17 kg/m2  Wt Readings from Last 3 Encounters:  09/24/15 259 lb 6.4 oz (117.663 kg)    07/29/15 259 lb (117.482 kg)  03/19/15 255 lb (115.667 kg)     Gen: NAD, alert, cooperative with exam, NCAT EYES: EOMI, no scleral injection or icterus ENT:  TMs pearly gray b/l, OP with mild erythema LYMPH: no cervical LAD CV: NRRR, normal S1/S2, no murmur, distal pulses 2+ b/l Resp: CTABL, no wheezes, normal WOB Abd: +BS, soft, NTND. no guarding or organomegaly Ext: No edema, warm Neuro: Alert and oriented, strength equal b/l UE and LE, coordination grossly normal MSK: normal muscle bulk     Assessment & Plan:    Jeffrey Barnett was seen today for sore throat, likely due to acute URI. Rapid strep negative. Will send for culture. Discussed symptomatica care and return precautions. No indications for antibiotics at this time.  Diagnoses and all orders for this visit:  Sore throat -     POCT rapid strep A - culture, Strep  Acute URI    Follow up plan: Return if symptoms worsen or fail to improve.  Assunta Found, MD [facility] 09/24/2015, 12:45 PM

## 2015-09-24 NOTE — Patient Instructions (Addendum)
Netipot with distilled water twice a day to clear out sinuses Ibuprofen and tylenol Lots of fluids Chloraseptic throat spray

## 2015-09-27 LAB — CULTURE, GROUP A STREP

## 2015-10-10 ENCOUNTER — Encounter: Payer: Self-pay | Admitting: *Deleted

## 2015-10-18 ENCOUNTER — Other Ambulatory Visit: Payer: Self-pay | Admitting: Family Medicine

## 2015-11-21 ENCOUNTER — Other Ambulatory Visit: Payer: Self-pay | Admitting: Family Medicine

## 2015-12-03 ENCOUNTER — Ambulatory Visit: Payer: BLUE CROSS/BLUE SHIELD | Admitting: Family Medicine

## 2015-12-05 ENCOUNTER — Encounter: Payer: Self-pay | Admitting: Family Medicine

## 2015-12-05 ENCOUNTER — Ambulatory Visit (INDEPENDENT_AMBULATORY_CARE_PROVIDER_SITE_OTHER): Payer: BLUE CROSS/BLUE SHIELD | Admitting: Family Medicine

## 2015-12-05 VITALS — BP 113/74 | HR 82 | Temp 97.1°F | Ht 72.0 in | Wt 257.0 lb

## 2015-12-05 DIAGNOSIS — E559 Vitamin D deficiency, unspecified: Secondary | ICD-10-CM

## 2015-12-05 DIAGNOSIS — E785 Hyperlipidemia, unspecified: Secondary | ICD-10-CM | POA: Diagnosis not present

## 2015-12-05 DIAGNOSIS — J301 Allergic rhinitis due to pollen: Secondary | ICD-10-CM | POA: Diagnosis not present

## 2015-12-05 DIAGNOSIS — I10 Essential (primary) hypertension: Secondary | ICD-10-CM

## 2015-12-05 DIAGNOSIS — N400 Enlarged prostate without lower urinary tract symptoms: Secondary | ICD-10-CM | POA: Diagnosis not present

## 2015-12-05 MED ORDER — FLUTICASONE PROPIONATE 50 MCG/ACT NA SUSP: 2.0000 | Freq: Every day | NASAL | Status: DC

## 2015-12-05 NOTE — Patient Instructions (Addendum)
Continue current medications. Continue good therapeutic lifestyle changes which include good diet and exercise. Fall precautions discussed with patient. If an FOBT was given today- please return it to our front desk. If you are over 57 years old - you may need Prevnar 56 or the adult Pneumonia vaccine.  **Flu shots are available--- please call and schedule a FLU-CLINIC appointment**  After your visit with [place] today you will receive a survey in the mail or online from [employer] regarding your care with [place]. Please take a moment to fill this out. Your feedback is very important to [place] as you can help [place] better understand your patient needs as well as improve your experience and satisfaction. WE CARE ABOUT YOU!!!   Use Allegra or Claritin as a nonsedating antihistamine over-the-counter along with Flonase 1-2 sprays each nostril at bedtime The patient can also use nasal saline during the day and he should stay well hydrated

## 2015-12-05 NOTE — Progress Notes (Signed)
Subjective:    Patient ID: Jeffrey Barnett, male    DOB: Feb 14, 1961, 55 y.o.   MRN: [id number]  HPI Pt here for follow up and management of chronic medical problems which includes hyperlipidemia and hypertension. He is taking medications regularly.Patient is having more trouble and with his allergic rhinitis with head congestion and drainage. He will return to the office for his fasting lab work. The patient denies any chest pain shortness of breath trouble swallowing heartburn indigestion nausea vomiting diarrhea or blood in the stool. His biggest concern is his head congestion and nasal congestion. He is not having any headaches with this. He is up-to-date on his health maintenance issues.     Patient Active Problem List   Diagnosis Date Noted  . Hypertension 04/03/2013  . Fever blister, history of   . Hyperlipidemia   . Villous adenoma, history of    Outpatient Encounter Prescriptions as of 12/05/2015  Medication Sig  . Cholecalciferol (VITAMIN D) 2000 UNITS CAPS Take 1 capsule by mouth daily.  . enalapril (VASOTEC) 10 MG tablet TAKE 1 TABLET EVERY DAY  . Omega-3 Fatty Acids (FISH OIL) 1000 MG CAPS Take 2 capsules by mouth daily.    . rosuvastatin (CRESTOR) 20 MG tablet Take 1 tablet (20 mg total) by mouth daily.  . sildenafil (REVATIO) 20 MG tablet Take 2-5 tabs as needed prior to sexual activity.  . valACYclovir (VALTREX) 1000 MG tablet TAKE 1 TABLET EVERY DAY AS DIRECTED  . [DISCONTINUED] valACYclovir (VALTREX) 1000 MG tablet TAKE 1 TABLET EVERY DAY AS DIRECTED   No facility-administered encounter medications on file as of 12/05/2015.     Review of Systems  Constitutional: Negative.   HENT: Positive for congestion (nasal and burning) and postnasal drip.   Eyes: Negative.   Respiratory: Negative.   Cardiovascular: Negative.   Gastrointestinal: Negative.   Endocrine: Negative.   Genitourinary: Negative.   Musculoskeletal: Negative.   Skin: Negative.   Allergic/Immunologic:  Negative.   Neurological: Negative.   Hematological: Negative.   Psychiatric/Behavioral: Negative.        Objective:   Physical Exam  Constitutional: He is oriented to person, place, and time. He appears well-developed and well-nourished. No distress.  HENT:  Head: Normocephalic and atraumatic.  Right Ear: External ear normal.  Left Ear: External ear normal.  Mouth/Throat: Oropharynx is clear and moist. No oropharyngeal exudate.  Nasal congestion and turbinate swelling bilaterally  Eyes: Conjunctivae and EOM are normal. Pupils are equal, round, and reactive to light. Right eye exhibits no discharge. Left eye exhibits no discharge. No scleral icterus.  Recent eye exam through 4 months ago with new glasses  Neck: Normal range of motion. Neck supple. No thyromegaly present.  No bruits or anterior cervical adenopathy  Cardiovascular: Normal rate, regular rhythm, normal heart sounds and intact distal pulses.   No murmur heard. Heart had a regular rate and rhythm at 72/m  Pulmonary/Chest: Effort normal and breath sounds normal. No respiratory distress. He has no wheezes. He has no rales. He exhibits no tenderness.  Clear anteriorly and posteriorly  Abdominal: Soft. Bowel sounds are normal. He exhibits no mass. There is no tenderness. There is no rebound and no guarding.  Abdominal obesity without masses or organ enlargement or suprapubic tenderness  Musculoskeletal: Normal range of motion. He exhibits no edema.  Lymphadenopathy:    He has no cervical adenopathy.  Neurological: He is alert and oriented to person, place, and time. He has normal reflexes. No cranial nerve deficit.  Skin: Skin is warm and dry. No rash noted.  Psychiatric: He has a normal mood and affect. His behavior is normal. Judgment and thought content normal.  Nursing note and vitals reviewed.   BP 113/74 mmHg  Pulse 82  Temp(Src) 97.1 F (36.2 C) (Oral)  Ht 6' (1.829 m)  Wt 257 lb (116.574 kg)  BMI 34.85  kg/m2       Assessment & Plan:  1. Essential hypertension -The blood pressure is good today and he will continue with current treatment - BMP8+EGFR; Future - CBC with Differential/Platelet; Future - Hepatic function panel; Future  2. Hyperlipidemia -He will return to the office for fasting lab work and we'll continue current treatment and therapeutic lifestyle changes until labs are returned - CBC with Differential/Platelet; Future - Lipid panel; Future  3. BPH (benign prostatic hypertrophy) -No complaints with voiding - CBC with Differential/Platelet; Future  4. Vitamin D deficiency -Continue vitamin D replacement pending results of lab work - CBC with Differential/Platelet; Future - VITAMIN D 25 Hydroxy (Vit-D Deficiency, Fractures); Future  5. Allergic rhinitis due to pollen -Add Claritin or Allegra and use Flonase 1-2 sprays each nostril at bedtime  Meds ordered this encounter  Medications  . fluticasone (FLONASE) 50 MCG/ACT nasal spray    Sig: Place 2 sprays into both nostrils daily.    Dispense:  16 g    Refill:  6   Patient Instructions  Continue current medications. Continue good therapeutic lifestyle changes which include good diet and exercise. Fall precautions discussed with patient. If an FOBT was given today- please return it to our front desk. If you are over 63 years old - you may need Prevnar 69 or the adult Pneumonia vaccine.  **Flu shots are available--- please call and schedule a FLU-CLINIC appointment**  After your visit with [place] today you will receive a survey in the mail or online from [employer] regarding your care with [place]. Please take a moment to fill this out. Your feedback is very important to [place] as you can help [place] better understand your patient needs as well as improve your experience and satisfaction. WE CARE ABOUT YOU!!!   Use Allegra or Claritin as a nonsedating antihistamine over-the-counter along with Flonase 1-2 sprays each nostril at  bedtime The patient can also use nasal saline during the day and he should stay well hydrated   Arrie Senate MD

## 2015-12-14 ENCOUNTER — Other Ambulatory Visit: Payer: BLUE CROSS/BLUE SHIELD

## 2015-12-14 DIAGNOSIS — E785 Hyperlipidemia, unspecified: Secondary | ICD-10-CM

## 2015-12-14 DIAGNOSIS — I10 Essential (primary) hypertension: Secondary | ICD-10-CM | POA: Diagnosis not present

## 2015-12-14 DIAGNOSIS — E559 Vitamin D deficiency, unspecified: Secondary | ICD-10-CM

## 2015-12-14 DIAGNOSIS — N400 Enlarged prostate without lower urinary tract symptoms: Secondary | ICD-10-CM

## 2015-12-15 LAB — SPECIMEN STATUS REPORT

## 2015-12-16 LAB — CBC WITH DIFFERENTIAL/PLATELET
Basophils Absolute: 0 10*3/uL (ref 0.0–0.2)
Basos: 0 %
EOS (ABSOLUTE): 0.1 10*3/uL (ref 0.0–0.4)
Eos: 1 %
Hematocrit: 45 % (ref 37.5–51.0)
Hemoglobin: 15.3 g/dL (ref 12.6–17.7)
Immature Grans (Abs): 0 10*3/uL (ref 0.0–0.1)
Immature Granulocytes: 0 %
Lymphocytes Absolute: 1.4 10*3/uL (ref 0.7–3.1)
Lymphs: 23 %
MCH: 29 pg (ref 26.6–33.0)
MCHC: 34 g/dL (ref 31.5–35.7)
MCV: 85 fL (ref 79–97)
Monocytes Absolute: 0.6 10*3/uL (ref 0.1–0.9)
Monocytes: 10 %
Neutrophils Absolute: 4.1 10*3/uL (ref 1.4–7.0)
Neutrophils: 66 %
Platelets: 223 10*3/uL (ref 150–379)
RBC: 5.27 x10E6/uL (ref 4.14–5.80)
RDW: 13.7 % (ref 12.3–15.4)
WBC: 6.2 10*3/uL (ref 3.4–10.8)

## 2015-12-16 LAB — BMP8+EGFR
BUN/Creatinine Ratio: 20 (ref 9–20)
BUN: 18 mg/dL (ref 6–24)
CO2: 20 mmol/L (ref 18–29)
Calcium: 8.9 mg/dL (ref 8.7–10.2)
Chloride: 103 mmol/L (ref 96–106)
Creatinine, Ser: 0.92 mg/dL (ref 0.76–1.27)
GFR calc Af Amer: 106 mL/min/{1.73_m2} (ref >59)
GFR calc non Af Amer: 92 mL/min/{1.73_m2} (ref >59)
Glucose: 134 mg/dL — ABNORMAL HIGH (ref 65–99)
Potassium: 4.7 mmol/L (ref 3.5–5.2)
Sodium: 141 mmol/L (ref 134–144)

## 2015-12-16 LAB — HEPATIC FUNCTION PANEL
ALT: 19 IU/L (ref 0–44)
AST: 22 IU/L (ref 0–40)
Albumin: 4.3 g/dL (ref 3.5–5.5)
Alkaline Phosphatase: 75 IU/L (ref 39–117)
Bilirubin Total: 0.3 mg/dL (ref 0.0–1.2)
Bilirubin, Direct: 0.13 mg/dL (ref 0.00–0.40)
Total Protein: 7 g/dL (ref 6.0–8.5)

## 2015-12-16 LAB — LIPID PANEL
Chol/HDL Ratio: 2.7 ratio units (ref 0.0–5.0)
Cholesterol, Total: 118 mg/dL (ref 100–199)
HDL: 43 mg/dL (ref >39)
LDL Calculated: 58 mg/dL (ref 0–99)
Triglycerides: 84 mg/dL (ref 0–149)
VLDL Cholesterol Cal: 17 mg/dL (ref 5–40)

## 2015-12-16 LAB — VITAMIN D 25 HYDROXY (VIT D DEFICIENCY, FRACTURES): Vit D, 25-Hydroxy: 39.3 ng/mL (ref 30.0–100.0)

## 2016-01-19 ENCOUNTER — Other Ambulatory Visit: Payer: Self-pay | Admitting: Family Medicine

## 2016-04-01 ENCOUNTER — Ambulatory Visit: Payer: BLUE CROSS/BLUE SHIELD | Admitting: Family Medicine

## 2016-04-20 ENCOUNTER — Ambulatory Visit (INDEPENDENT_AMBULATORY_CARE_PROVIDER_SITE_OTHER): Payer: BLUE CROSS/BLUE SHIELD | Admitting: Family Medicine

## 2016-04-20 ENCOUNTER — Ambulatory Visit (INDEPENDENT_AMBULATORY_CARE_PROVIDER_SITE_OTHER): Payer: BLUE CROSS/BLUE SHIELD

## 2016-04-20 ENCOUNTER — Encounter: Payer: Self-pay | Admitting: Family Medicine

## 2016-04-20 VITALS — BP 114/68 | HR 65 | Temp 97.3°F | Ht 72.0 in | Wt 255.0 lb

## 2016-04-20 DIAGNOSIS — Z0000 Encounter for general adult medical examination without abnormal findings: Secondary | ICD-10-CM | POA: Diagnosis not present

## 2016-04-20 DIAGNOSIS — E785 Hyperlipidemia, unspecified: Secondary | ICD-10-CM

## 2016-04-20 DIAGNOSIS — N5201 Erectile dysfunction due to arterial insufficiency: Secondary | ICD-10-CM

## 2016-04-20 DIAGNOSIS — E559 Vitamin D deficiency, unspecified: Secondary | ICD-10-CM

## 2016-04-20 DIAGNOSIS — I10 Essential (primary) hypertension: Secondary | ICD-10-CM | POA: Diagnosis not present

## 2016-04-20 DIAGNOSIS — N400 Enlarged prostate without lower urinary tract symptoms: Secondary | ICD-10-CM

## 2016-04-20 LAB — MICROSCOPIC EXAMINATION
Bacteria, UA: NONE SEEN
Epithelial Cells (non renal): NONE SEEN /hpf (ref 0–10)
RBC, UA: NONE SEEN /hpf (ref 0–?)
WBC, UA: NONE SEEN /hpf (ref 0–?)

## 2016-04-20 LAB — URINALYSIS, COMPLETE
Bilirubin, UA: NEGATIVE
Glucose, UA: NEGATIVE
Ketones, UA: NEGATIVE
Leukocytes, UA: NEGATIVE
Nitrite, UA: NEGATIVE
Protein, UA: NEGATIVE
RBC, UA: NEGATIVE
Specific Gravity, UA: 1.025 (ref 1.005–1.030)
Urobilinogen, Ur: 0.2 mg/dL (ref 0.2–1.0)
pH, UA: 5.5 (ref 5.0–7.5)

## 2016-04-20 MED ORDER — ENALAPRIL MALEATE 10 MG PO TABS: 10.0000 mg | ORAL_TABLET | Freq: Every day | ORAL | 3 refills | Status: DC

## 2016-04-20 NOTE — Patient Instructions (Addendum)
Continue current medications. Continue good therapeutic lifestyle changes which include good diet and exercise. Fall precautions discussed with patient. If an FOBT was given today- please return it to our front desk. If you are over 57 years old - you may need Prevnar 13 or the adult Pneumonia vaccine.   After your visit with [place] today you will receive a survey in the mail or online from [employer] regarding your care with [place]. Please take a moment to fill this out. Your feedback is very important to [place] as you can help [place] better understand your patient needs as well as improve your experience and satisfaction. WE CARE ABOUT YOU!!!   The patient should check with his insurance regarding the Prevnar vaccine He should return to the office fasting for his blood work We will call with the results of the chest x-ray and the FOBT as soon as those results become available

## 2016-04-20 NOTE — Addendum Note (Signed)
Addended by: Zannie Cove on: 04/20/2016 04:45 PM   Modules accepted: Orders

## 2016-04-20 NOTE — Progress Notes (Signed)
Subjective:    Patient ID: Jeffrey Barnett, male    DOB: 09-Dec-1960, 55 y.o.   MRN: [id number]  HPI Patient is here today for annual wellness exam and follow up of chronic medical problems which includes hyperlipidemia and hypertension. He is taking medications regularly.The patient is doing well overall. He comes in today with no specific complaints. He is requesting a refill on his enalapril for his blood pressure. He is also due to his rectal exam and PSA test. We'll get a urinalysis. He will return an FOBT and get a chest x-ray. His lab work will be done at a different time as he will return fasting for this. His weight is down a couple pounds from the last visit and his body mass index is 34.85. His also due to get an EKG. The patient is under a lot of stress at work as they have changed operating systems and he is getting used to this now. He is been spending a lot of time extra that he has not spent in the past. He denies any chest pain chest pressure or shortness of breath. He denies any problems with his GI tract including nausea vomiting diarrhea blood in the stool heartburn swallowing or black tarry bowel movements. He is having no trouble passing his water no burning pain frequency. He does have a history of colon polyps and will be due another colonoscopy in the fall of 2019. This is 5 years since his previous colonoscopy. His family history was reviewed his mom is passed away and his dad still living. He will get a chest x-ray and an EKG today. The patient junk his brother has pericarditis and is being followed by a specialist for this. He is 4 years younger than the patient.      Patient Active Problem List   Diagnosis Date Noted  . Hypertension 04/03/2013  . Fever blister, history of   . Hyperlipidemia   . Villous adenoma, history of    Outpatient Encounter Prescriptions as of 04/20/2016  Medication Sig  . Cholecalciferol (VITAMIN D) 2000 UNITS CAPS Take 1 capsule by mouth daily.    . enalapril (VASOTEC) 10 MG tablet TAKE 1 TABLET EVERY DAY  . fluticasone (FLONASE) 50 MCG/ACT nasal spray Place 2 sprays into both nostrils daily.  . Omega-3 Fatty Acids (FISH OIL) 1000 MG CAPS Take 2 capsules by mouth daily.    . rosuvastatin (CRESTOR) 20 MG tablet Take 1 tablet (20 mg total) by mouth daily.  . sildenafil (REVATIO) 20 MG tablet Take 2-5 tabs as needed prior to sexual activity.  . valACYclovir (VALTREX) 1000 MG tablet TAKE 1 TABLET EVERY DAY AS DIRECTED   No facility-administered encounter medications on file as of 04/20/2016.       Review of Systems  Constitutional: Negative.   HENT: Negative.   Eyes: Negative.   Respiratory: Negative.   Cardiovascular: Negative.   Gastrointestinal: Negative.   Endocrine: Negative.   Genitourinary: Negative.   Musculoskeletal: Negative.   Skin: Negative.   Allergic/Immunologic: Negative.   Neurological: Negative.   Hematological: Negative.   Psychiatric/Behavioral: Negative.        Objective:   Physical Exam  Constitutional: He is oriented to person, place, and time. He appears well-developed and well-nourished. No distress.  HENT:  Head: Normocephalic and atraumatic.  Right Ear: External ear normal.  Left Ear: External ear normal.  Mouth/Throat: Oropharynx is clear and moist. No oropharyngeal exudate.  Minimal nasal congestion and ears cerumen bilaterally  Eyes: Conjunctivae and EOM are normal. Pupils are equal, round, and reactive to light. Right eye exhibits no discharge. Left eye exhibits no discharge. No scleral icterus.  Neck: Normal range of motion. Neck supple. No thyromegaly present.  No bruits anterior cervical adenopathy or thyromegaly  Cardiovascular: Normal rate, regular rhythm, normal heart sounds and intact distal pulses.   No murmur heard. The heart is regular at 72/m  Pulmonary/Chest: Effort normal and breath sounds normal. No respiratory distress. He has no wheezes. He has no rales. He exhibits no  tenderness.  Clear anteriorly and posteriorly and no axillary adenopathy  Abdominal: Soft. Bowel sounds are normal. He exhibits no mass. There is no tenderness. There is no rebound and no guarding.  No abdominal tenderness liver or spleen enlargement or inguinal adenopathy  Genitourinary: Rectum normal and penis normal.  Genitourinary Comments: The prostate is slightly enlarged without lumps or masses. There is no rectal masses. There are no inguinal hernias palpable. The external genitalia were within normal limits.  Musculoskeletal: Normal range of motion. He exhibits no edema.  Lymphadenopathy:    He has no cervical adenopathy.  Neurological: He is alert and oriented to person, place, and time. He has normal reflexes. No cranial nerve deficit.  Skin: Skin is warm and dry. No rash noted.  Psychiatric: He has a normal mood and affect. His behavior is normal. Judgment and thought content normal.  Nursing note and vitals reviewed.  BP 114/68 (BP Location: Left Arm)   Pulse 65   Temp 97.3 F (36.3 C) (Oral)   Ht 6' (1.829 m)   Wt 255 lb (115.7 kg)   BMI 34.58 kg/m   EKG: Within normal limits  [facility] reading (PRIMARY) by  Dr.Moore-chest x-ray with results pending                                       Assessment & Plan:  1. Annual physical exam -The patient's last ETT was 2011 we will schedule him for another one at some point in the near future. - Urinalysis, Complete - BMP8+EGFR; Future - CBC with Differential/Platelet; Future - Hepatic function panel; Future - PSA, total and free; Future - VITAMIN D 25 Hydroxy (Vit-D Deficiency, Fractures); Future - Lipid panel; Future - DG Chest 2 View; Future - EKG 12-Lead  2. Hyperlipidemia -Continue with aggressive therapeutic lifestyle changes and current statin therapy pending results of lab work - CBC with Differential/Platelet; Future - Lipid panel; Future  3. Essential hypertension -The blood pressure is good today he should  continue with current treatment - BMP8+EGFR; Future - CBC with Differential/Platelet; Future - Hepatic function panel; Future - DG Chest 2 View; Future - EKG 12-Lead  4. BPH (benign prostatic hypertrophy) -The prostate was enlarged but the patient is having no voiding symptoms. - Urinalysis, Complete - CBC with Differential/Platelet; Future - PSA, total and free; Future  5. Vitamin D deficiency -Continue with current treatment pending results of lab work - CBC with Differential/Platelet; Future - VITAMIN D 25 Hydroxy (Vit-D Deficiency, Fractures); Future  6. Erectile dysfunction due to arterial insufficiency -Continue with generic Viagra as needed  Meds ordered this encounter  Medications  . enalapril (VASOTEC) 10 MG tablet    Sig: Take 1 tablet (10 mg total) by mouth daily.    Dispense:  90 tablet    Refill:  3   Patient Instructions  Continue current medications. Continue  good therapeutic lifestyle changes which include good diet and exercise. Fall precautions discussed with patient. If an FOBT was given today- please return it to our front desk. If you are over 48 years old - you may need Prevnar 85 or the adult Pneumonia vaccine.   After your visit with [place] today you will receive a survey in the mail or online from [employer] regarding your care with [place]. Please take a moment to fill this out. Your feedback is very important to [place] as you can help [place] better understand your patient needs as well as improve your experience and satisfaction. WE CARE ABOUT YOU!!!   The patient should check with his insurance regarding the Prevnar vaccine He should return to the office fasting for his blood work We will call with the results of the chest x-ray and the FOBT as soon as those results become available    Arrie Senate MD

## 2016-04-25 ENCOUNTER — Other Ambulatory Visit: Payer: BLUE CROSS/BLUE SHIELD

## 2016-04-25 DIAGNOSIS — I10 Essential (primary) hypertension: Secondary | ICD-10-CM | POA: Diagnosis not present

## 2016-04-25 DIAGNOSIS — E559 Vitamin D deficiency, unspecified: Secondary | ICD-10-CM

## 2016-04-25 DIAGNOSIS — E785 Hyperlipidemia, unspecified: Secondary | ICD-10-CM | POA: Diagnosis not present

## 2016-04-25 DIAGNOSIS — Z0000 Encounter for general adult medical examination without abnormal findings: Secondary | ICD-10-CM | POA: Diagnosis not present

## 2016-04-25 DIAGNOSIS — N400 Enlarged prostate without lower urinary tract symptoms: Secondary | ICD-10-CM

## 2016-04-27 LAB — HEPATIC FUNCTION PANEL
ALT: 16 IU/L (ref 0–44)
AST: 21 IU/L (ref 0–40)
Albumin: 4.3 g/dL (ref 3.5–5.5)
Alkaline Phosphatase: 75 IU/L (ref 39–117)
Bilirubin Total: 0.5 mg/dL (ref 0.0–1.2)
Bilirubin, Direct: 0.14 mg/dL (ref 0.00–0.40)
Total Protein: 6.7 g/dL (ref 6.0–8.5)

## 2016-04-27 LAB — LIPID PANEL
Chol/HDL Ratio: 2.9 ratio units (ref 0.0–5.0)
Cholesterol, Total: 123 mg/dL (ref 100–199)
HDL: 43 mg/dL (ref >39)
LDL Calculated: 62 mg/dL (ref 0–99)
Triglycerides: 91 mg/dL (ref 0–149)
VLDL Cholesterol Cal: 18 mg/dL (ref 5–40)

## 2016-04-27 LAB — BMP8+EGFR
BUN/Creatinine Ratio: 21 — ABNORMAL HIGH (ref 9–20)
BUN: 18 mg/dL (ref 6–24)
CO2: 21 mmol/L (ref 18–29)
Calcium: 9 mg/dL (ref 8.7–10.2)
Chloride: 101 mmol/L (ref 96–106)
Creatinine, Ser: 0.87 mg/dL (ref 0.76–1.27)
GFR calc Af Amer: 111 mL/min/{1.73_m2} (ref >59)
GFR calc non Af Amer: 96 mL/min/{1.73_m2} (ref >59)
Glucose: 125 mg/dL — ABNORMAL HIGH (ref 65–99)
Potassium: 4.6 mmol/L (ref 3.5–5.2)
Sodium: 139 mmol/L (ref 134–144)

## 2016-04-27 LAB — CBC WITH DIFFERENTIAL/PLATELET
Basophils Absolute: 0 10*3/uL (ref 0.0–0.2)
Basos: 1 %
EOS (ABSOLUTE): 0 10*3/uL (ref 0.0–0.4)
Eos: 1 %
Hematocrit: 44.7 % (ref 37.5–51.0)
Hemoglobin: 14.6 g/dL (ref 12.6–17.7)
Immature Grans (Abs): 0 10*3/uL (ref 0.0–0.1)
Immature Granulocytes: 0 %
Lymphocytes Absolute: 1.5 10*3/uL (ref 0.7–3.1)
Lymphs: 26 %
MCH: 29.1 pg (ref 26.6–33.0)
MCHC: 32.7 g/dL (ref 31.5–35.7)
MCV: 89 fL (ref 79–97)
Monocytes Absolute: 0.4 10*3/uL (ref 0.1–0.9)
Monocytes: 7 %
Neutrophils Absolute: 3.7 10*3/uL (ref 1.4–7.0)
Neutrophils: 65 %
Platelets: 199 10*3/uL (ref 150–379)
RBC: 5.02 x10E6/uL (ref 4.14–5.80)
RDW: 13.9 % (ref 12.3–15.4)
WBC: 5.7 10*3/uL (ref 3.4–10.8)

## 2016-04-27 LAB — PSA, TOTAL AND FREE
PSA, Free Pct: 19 %
PSA, Free: 0.19 ng/mL
Prostate Specific Ag, Serum: 1 ng/mL (ref 0.0–4.0)

## 2016-04-27 LAB — VITAMIN D 25 HYDROXY (VIT D DEFICIENCY, FRACTURES): Vit D, 25-Hydroxy: 57.4 ng/mL (ref 30.0–100.0)

## 2016-04-29 ENCOUNTER — Telehealth: Payer: Self-pay | Admitting: *Deleted

## 2016-04-29 NOTE — Telephone Encounter (Signed)
-----   Message from Worthy Rancher, MD sent at 04/25/2016  8:09 AM EDT ----- Regarding: RE: treadmill  We can go ahead and do this Luvenia Heller ----- Message ----- From: Thana Ates, LPN Sent: [id number]  12:13 PM To: Georga Kaufmann, LPN, Theron Arista, RN, # Subject: treadmill                                      Please review for treadmill

## 2016-05-07 NOTE — Telephone Encounter (Signed)
Patient given appointment for 9/21 for treadmill and instructions placed in mail.

## 2016-05-10 ENCOUNTER — Other Ambulatory Visit: Payer: Self-pay | Admitting: Family Medicine

## 2016-05-28 ENCOUNTER — Ambulatory Visit (INDEPENDENT_AMBULATORY_CARE_PROVIDER_SITE_OTHER): Payer: BLUE CROSS/BLUE SHIELD

## 2016-05-28 DIAGNOSIS — E785 Hyperlipidemia, unspecified: Secondary | ICD-10-CM

## 2016-05-28 DIAGNOSIS — I10 Essential (primary) hypertension: Secondary | ICD-10-CM

## 2016-05-28 LAB — EXERCISE TOLERANCE TEST
Estimated workload: 7.6 METS
Exercise duration (min): 6 min
Exercise duration (sec): 36 s
MPHR: 163 {beats}/min
Peak HR: 148 {beats}/min
Percent HR: 90 %
RPE: 8
Rest HR: 73 {beats}/min

## 2016-06-11 ENCOUNTER — Encounter: Payer: Self-pay | Admitting: Family Medicine

## 2016-06-15 ENCOUNTER — Encounter: Payer: Self-pay | Admitting: *Deleted

## 2016-09-01 ENCOUNTER — Ambulatory Visit (INDEPENDENT_AMBULATORY_CARE_PROVIDER_SITE_OTHER): Payer: BLUE CROSS/BLUE SHIELD

## 2016-09-01 DIAGNOSIS — Z23 Encounter for immunization: Secondary | ICD-10-CM

## 2016-10-19 ENCOUNTER — Encounter: Payer: Self-pay | Admitting: Family Medicine

## 2016-10-21 ENCOUNTER — Ambulatory Visit: Payer: BLUE CROSS/BLUE SHIELD | Admitting: Family Medicine

## 2016-11-11 ENCOUNTER — Encounter: Payer: Self-pay | Admitting: Family Medicine

## 2016-11-11 ENCOUNTER — Ambulatory Visit (INDEPENDENT_AMBULATORY_CARE_PROVIDER_SITE_OTHER): Payer: BLUE CROSS/BLUE SHIELD | Admitting: Family Medicine

## 2016-11-11 VITALS — BP 116/72 | HR 73 | Temp 97.9°F | Ht 72.0 in | Wt 253.0 lb

## 2016-11-11 DIAGNOSIS — H9193 Unspecified hearing loss, bilateral: Secondary | ICD-10-CM

## 2016-11-11 DIAGNOSIS — E7800 Pure hypercholesterolemia, unspecified: Secondary | ICD-10-CM

## 2016-11-11 DIAGNOSIS — E559 Vitamin D deficiency, unspecified: Secondary | ICD-10-CM | POA: Diagnosis not present

## 2016-11-11 DIAGNOSIS — I10 Essential (primary) hypertension: Secondary | ICD-10-CM | POA: Diagnosis not present

## 2016-11-11 DIAGNOSIS — H9313 Tinnitus, bilateral: Secondary | ICD-10-CM | POA: Diagnosis not present

## 2016-11-11 NOTE — Patient Instructions (Addendum)
Continue current medications. Continue good therapeutic lifestyle changes which include good diet and exercise. Fall precautions discussed with patient. If an FOBT was given today- please return it to our front desk. If you are over 58 years old - you may need Prevnar 74 or the adult Pneumonia vaccine.  **Flu shots are available--- please call and schedule a FLU-CLINIC appointment**  After your visit with [place] today you will receive a survey in the mail or online from [employer] regarding your care with [place]. Please take a moment to fill this out. Your feedback is very important to [place] as you can help [place] better understand your patient needs as well as improve your experience and satisfaction. WE CARE ABOUT YOU!!!   We will arrange for you to see a hearing and ear specialist. We'll call you without appointment once it is made. Continue to use the Flonase regularly every morning after your shower and after blowing her nose. Use nasal saline during the day.

## 2016-11-11 NOTE — Progress Notes (Signed)
Subjective:    Patient ID: Jeffrey Barnett, male    DOB: June 01, 1961, 56 y.o.   MRN: [id number]  HPI Pt here for follow up and management of chronic medical problems which includes hyperlipidemia and hypertension. He is taking medication regularly.Patient is here for routine follow-up of his ongoing problems of hypertension and hyperlipidemia. He complains today of some tinnitus in his ears and some head congestion. He will get his lab work done this weekend and will be given an FOBT to return. His last colonoscopy was in November 2014. She says he's had the tinnitus for years but it may be somewhat worse recently as specimen make it worse with increased times of head congestion. He does use Flonase but he does not use this regularly. He's has some head congestion recently. His wife tells him that he does not hear well. The patient denies any chest pain or palpitations. He denies shortness of breath. He denies any problems with his stomach including nausea vomiting diarrhea blood in the stool or black tarry bowel movements. He is up-to-date on his colonoscopies and will not need another one until 2024. He's passing his water without problems. He will come in for lab work this weekend.    Patient Active Problem List   Diagnosis Date Noted  . Hypertension 04/03/2013  . Fever blister, history of   . Hyperlipidemia   . Villous adenoma, history of    Outpatient Encounter Prescriptions as of 11/11/2016  Medication Sig  . Cholecalciferol (VITAMIN D) 2000 UNITS CAPS Take 1 capsule by mouth daily.  . enalapril (VASOTEC) 10 MG tablet Take 1 tablet (10 mg total) by mouth daily.  . fluticasone (FLONASE) 50 MCG/ACT nasal spray Place 2 sprays into both nostrils daily.  . Omega-3 Fatty Acids (FISH OIL) 1000 MG CAPS Take 2 capsules by mouth daily.    . rosuvastatin (CRESTOR) 20 MG tablet TAKE 1 TABLET (20 MG TOTAL) BY MOUTH DAILY.  . sildenafil (REVATIO) 20 MG tablet Take 2-5 tabs as needed prior to sexual  activity.  . valACYclovir (VALTREX) 1000 MG tablet TAKE 1 TABLET EVERY DAY AS DIRECTED   No facility-administered encounter medications on file as of 11/11/2016.       Review of Systems  Constitutional: Negative.   HENT: Positive for congestion.        Ears ringing  Eyes: Negative.   Respiratory: Negative.   Cardiovascular: Negative.   Gastrointestinal: Negative.   Endocrine: Negative.   Genitourinary: Negative.   Musculoskeletal: Negative.   Skin: Negative.   Allergic/Immunologic: Negative.   Neurological: Negative.   Hematological: Negative.   Psychiatric/Behavioral: Negative.        Objective:   Physical Exam  Constitutional: He is oriented to person, place, and time. He appears well-developed and well-nourished. No distress.  The patient is pleasant and alert.  HENT:  Head: Normocephalic and atraumatic.  Left Ear: External ear normal.  Nose: Nose normal.  Mouth/Throat: Oropharynx is clear and moist. No oropharyngeal exudate.  Bilateral ears cerumen with left being worse than the right. Both TMs were visualized and appeared normal.  Eyes: Conjunctivae and EOM are normal. Pupils are equal, round, and reactive to light. Right eye exhibits no discharge. Left eye exhibits no discharge. No scleral icterus.  Neck: Normal range of motion. Neck supple. No thyromegaly present.  No bruits thyromegaly or anterior cervical adenopathy  Cardiovascular: Normal rate, regular rhythm, normal heart sounds and intact distal pulses.   No murmur heard. The heart was regular  at 72/m  Pulmonary/Chest: Effort normal and breath sounds normal. No respiratory distress. He has no wheezes. He has no rales. He exhibits no tenderness.  Clear anteriorly and posteriorly  Abdominal: Soft. Bowel sounds are normal. He exhibits no mass. There is no tenderness. There is no rebound and no guarding.  No liver or spleen enlargement no abdominal tenderness and no masses and no bruits  Musculoskeletal: Normal  range of motion. He exhibits no edema.  Lymphadenopathy:    He has no cervical adenopathy.  Neurological: He is alert and oriented to person, place, and time. He has normal reflexes. No cranial nerve deficit.  Skin: Skin is warm and dry. No rash noted.  Psychiatric: He has a normal mood and affect. His behavior is normal. Judgment and thought content normal.  Nursing note and vitals reviewed.  BP 116/72 (BP Location: Right Arm)   Pulse 73   Temp 97.9 F (36.6 C) (Oral)   Ht 6' (1.829 m)   Wt 253 lb (114.8 kg)   BMI 34.31 kg/m         Assessment & Plan:  1. Pure hypercholesterolemia -Continue current treatment pending results of lab work - CBC with Differential/Platelet; Future - NMR, lipoprofile; Future  2. Essential hypertension -The blood pressure is good today and he will continue with current treatment - BMP8+EGFR; Future - CBC with Differential/Platelet; Future - Hepatic function panel; Future  3. Vitamin D deficiency -Continue with vitamin D replacement pending results of lab work - CBC with Differential/Platelet; Future - VITAMIN D 25 Hydroxy (Vit-D Deficiency, Fractures); Future  4. Tinnitus of both ears - Ambulatory referral to ENT  5. Decreased hearing of both ears -Referred to ear specialist for hearing evaluation  Patient Instructions  Continue current medications. Continue good therapeutic lifestyle changes which include good diet and exercise. Fall precautions discussed with patient. If an FOBT was given today- please return it to our front desk. If you are over 23 years old - you may need Prevnar 65 or the adult Pneumonia vaccine.  **Flu shots are available--- please call and schedule a FLU-CLINIC appointment**  After your visit with [place] today you will receive a survey in the mail or online from [employer] regarding your care with [place]. Please take a moment to fill this out. Your feedback is very important to [place] as you can help [place] better understand  your patient needs as well as improve your experience and satisfaction. WE CARE ABOUT YOU!!!   We will arrange for you to see a hearing and ear specialist. We'll call you without appointment once it is made. Continue to use the Flonase regularly every morning after your shower and after blowing her nose. Use nasal saline during the day.  Arrie Senate MD

## 2016-11-14 ENCOUNTER — Other Ambulatory Visit (INDEPENDENT_AMBULATORY_CARE_PROVIDER_SITE_OTHER): Payer: BLUE CROSS/BLUE SHIELD

## 2016-11-14 DIAGNOSIS — E7800 Pure hypercholesterolemia, unspecified: Secondary | ICD-10-CM | POA: Diagnosis not present

## 2016-11-14 DIAGNOSIS — I10 Essential (primary) hypertension: Secondary | ICD-10-CM | POA: Diagnosis not present

## 2016-11-14 DIAGNOSIS — E559 Vitamin D deficiency, unspecified: Secondary | ICD-10-CM

## 2016-11-16 LAB — BMP8+EGFR
BUN/Creatinine Ratio: 18 (ref 9–20)
BUN: 16 mg/dL (ref 6–24)
CO2: 24 mmol/L (ref 18–29)
Calcium: 9.1 mg/dL (ref 8.7–10.2)
Chloride: 101 mmol/L (ref 96–106)
Creatinine, Ser: 0.91 mg/dL (ref 0.76–1.27)
GFR calc Af Amer: 107 mL/min/{1.73_m2} (ref >59)
GFR calc non Af Amer: 93 mL/min/{1.73_m2} (ref >59)
Glucose: 115 mg/dL — ABNORMAL HIGH (ref 65–99)
Potassium: 5.1 mmol/L (ref 3.5–5.2)
Sodium: 140 mmol/L (ref 134–144)

## 2016-11-16 LAB — CBC WITH DIFFERENTIAL/PLATELET
Basophils Absolute: 0 10*3/uL (ref 0.0–0.2)
Basos: 0 %
EOS (ABSOLUTE): 0.1 10*3/uL (ref 0.0–0.4)
Eos: 2 %
Hematocrit: 45 % (ref 37.5–51.0)
Hemoglobin: 15.1 g/dL (ref 13.0–17.7)
Immature Grans (Abs): 0 10*3/uL (ref 0.0–0.1)
Immature Granulocytes: 0 %
Lymphocytes Absolute: 1.5 10*3/uL (ref 0.7–3.1)
Lymphs: 23 %
MCH: 29.3 pg (ref 26.6–33.0)
MCHC: 33.6 g/dL (ref 31.5–35.7)
MCV: 87 fL (ref 79–97)
Monocytes Absolute: 0.6 10*3/uL (ref 0.1–0.9)
Monocytes: 9 %
Neutrophils Absolute: 4.4 10*3/uL (ref 1.4–7.0)
Neutrophils: 66 %
Platelets: 238 10*3/uL (ref 150–379)
RBC: 5.16 x10E6/uL (ref 4.14–5.80)
RDW: 13.9 % (ref 12.3–15.4)
WBC: 6.7 10*3/uL (ref 3.4–10.8)

## 2016-11-16 LAB — HEPATIC FUNCTION PANEL
ALT: 18 IU/L (ref 0–44)
AST: 22 IU/L (ref 0–40)
Albumin: 4.4 g/dL (ref 3.5–5.5)
Alkaline Phosphatase: 83 IU/L (ref 39–117)
Bilirubin Total: 0.4 mg/dL (ref 0.0–1.2)
Bilirubin, Direct: 0.13 mg/dL (ref 0.00–0.40)
Total Protein: 6.9 g/dL (ref 6.0–8.5)

## 2016-11-16 LAB — NMR, LIPOPROFILE
Cholesterol: 143 mg/dL (ref 100–199)
HDL Cholesterol by NMR: 42 mg/dL (ref >39)
HDL Particle Number: 34.5 umol/L (ref >=30.5)
LDL Particle Number: 944 nmol/L (ref <1000)
LDL Size: 20.4 nm (ref >20.5)
LDL-C: 78 mg/dL (ref 0–99)
LP-IR Score: 71 — ABNORMAL HIGH (ref <=45)
Small LDL Particle Number: 515 nmol/L (ref <=527)
Triglycerides by NMR: 113 mg/dL (ref 0–149)

## 2016-11-16 LAB — VITAMIN D 25 HYDROXY (VIT D DEFICIENCY, FRACTURES): Vit D, 25-Hydroxy: 52.5 ng/mL (ref 30.0–100.0)

## 2016-12-10 DIAGNOSIS — H9313 Tinnitus, bilateral: Secondary | ICD-10-CM | POA: Diagnosis not present

## 2016-12-10 DIAGNOSIS — H903 Sensorineural hearing loss, bilateral: Secondary | ICD-10-CM | POA: Diagnosis not present

## 2016-12-18 ENCOUNTER — Other Ambulatory Visit: Payer: Self-pay | Admitting: Family Medicine

## 2017-02-08 ENCOUNTER — Other Ambulatory Visit: Payer: Self-pay | Admitting: Family Medicine

## 2017-04-23 ENCOUNTER — Other Ambulatory Visit: Payer: Self-pay | Admitting: Family Medicine

## 2017-05-13 ENCOUNTER — Ambulatory Visit (INDEPENDENT_AMBULATORY_CARE_PROVIDER_SITE_OTHER): Payer: BLUE CROSS/BLUE SHIELD | Admitting: Family Medicine

## 2017-05-13 ENCOUNTER — Encounter: Payer: Self-pay | Admitting: Family Medicine

## 2017-05-13 VITALS — BP 123/77 | HR 68 | Temp 97.9°F | Ht 72.0 in | Wt 253.0 lb

## 2017-05-13 DIAGNOSIS — I10 Essential (primary) hypertension: Secondary | ICD-10-CM

## 2017-05-13 DIAGNOSIS — E559 Vitamin D deficiency, unspecified: Secondary | ICD-10-CM

## 2017-05-13 DIAGNOSIS — Z0000 Encounter for general adult medical examination without abnormal findings: Secondary | ICD-10-CM | POA: Diagnosis not present

## 2017-05-13 DIAGNOSIS — E7800 Pure hypercholesterolemia, unspecified: Secondary | ICD-10-CM

## 2017-05-13 DIAGNOSIS — N400 Enlarged prostate without lower urinary tract symptoms: Secondary | ICD-10-CM

## 2017-05-13 DIAGNOSIS — W57XXXS Bitten or stung by nonvenomous insect and other nonvenomous arthropods, sequela: Secondary | ICD-10-CM

## 2017-05-13 LAB — URINALYSIS, COMPLETE
Bilirubin, UA: NEGATIVE
Glucose, UA: NEGATIVE
Ketones, UA: NEGATIVE
Leukocytes, UA: NEGATIVE
Nitrite, UA: NEGATIVE
Protein, UA: NEGATIVE
RBC, UA: NEGATIVE
Specific Gravity, UA: 1.02 (ref 1.005–1.030)
Urobilinogen, Ur: 1 mg/dL (ref 0.2–1.0)
pH, UA: 7 (ref 5.0–7.5)

## 2017-05-13 LAB — MICROSCOPIC EXAMINATION
Bacteria, UA: NONE SEEN
Epithelial Cells (non renal): NONE SEEN /hpf (ref 0–10)
RBC, UA: NONE SEEN /hpf (ref 0–?)
Renal Epithel, UA: NONE SEEN /hpf
WBC, UA: NONE SEEN /hpf (ref 0–?)

## 2017-05-13 MED ORDER — DOXYCYCLINE HYCLATE 100 MG PO TABS: 100.0000 mg | ORAL_TABLET | Freq: Two times a day (BID) | ORAL | 1 refills | Status: DC

## 2017-05-13 NOTE — Progress Notes (Signed)
Subjective:    Patient ID: Jeffrey Barnett, male    DOB: 03/15/1961, 56 y.o.   MRN: [id number]  HPI Patient is here today for annual wellness exam and follow up of chronic medical problems which includes hypertension and hyperlipidemia. He is taking medication regularly.The patient complains of some dry skin on his face. He is due today to get a rectal exam. He is also due to return his FOBT which he has at home. He will return to the office for fasting lab work and we'll get a urinalysis today. He does not need any refills on any of his medicines. His weight is stable from the previous visit and his vital signs are good. The patient does have a history of having several ticks removed from his body recently and these were deer ticks. The patient denies any chest pain or shortness of breath. He denies any trouble with swallowing heartburn indigestion nausea vomiting diarrhea or change in bowel habits or blood in the stool. His colonoscopies are up-to-date and there is no family history of colon cancer. He denies any trouble with passing his water burning pain or frequency. The skin lesion on his right for head above his right eye has been there for several weeks and doesn't seem to want to heal.   Patient Active Problem List   Diagnosis Date Noted  . Hypertension 04/03/2013  . Fever blister, history of   . Hyperlipidemia   . Villous adenoma, history of    Outpatient Encounter Prescriptions as of 05/13/2017  Medication Sig  . Cholecalciferol (VITAMIN D) 2000 UNITS CAPS Take 1 capsule by mouth daily.  . enalapril (VASOTEC) 10 MG tablet TAKE 1 TABLET (10 MG TOTAL) BY MOUTH DAILY.  . fluticasone (FLONASE) 50 MCG/ACT nasal spray Place 2 sprays into both nostrils daily.  . Omega-3 Fatty Acids (FISH OIL) 1000 MG CAPS Take 2 capsules by mouth daily.    . rosuvastatin (CRESTOR) 20 MG tablet TAKE 1 TABLET (20 MG TOTAL) BY MOUTH DAILY.  . sildenafil (REVATIO) 20 MG tablet TAKE 2-5 TABLETS BY MOUTH AS NEEDED  PRIOR TO SEXUAL ACTIVITY.  Marland Kitchen valACYclovir (VALTREX) 1000 MG tablet TAKE 1 TABLET EVERY DAY AS DIRECTED   No facility-administered encounter medications on file as of 05/13/2017.        Review of Systems  Constitutional: Negative.   HENT: Negative.   Eyes: Negative.   Respiratory: Negative.   Cardiovascular: Negative.   Gastrointestinal: Negative.   Endocrine: Negative.   Genitourinary: Negative.   Musculoskeletal: Negative.   Skin: Negative.        Dry patch on face Tick bites x 6   Allergic/Immunologic: Negative.   Neurological: Negative.   Hematological: Negative.   Psychiatric/Behavioral: Negative.        Objective:   Physical Exam  Constitutional: He is oriented to person, place, and time. He appears well-developed and well-nourished. No distress.  The patient is pleasant and alert  HENT:  Head: Normocephalic and atraumatic.  Right Ear: External ear normal.  Left Ear: External ear normal.  Nose: Nose normal.  Mouth/Throat: Oropharynx is clear and moist. No oropharyngeal exudate.  Minimal earwax bilaterally. Minimal nasal congestion bilaterally.  Eyes: Pupils are equal, round, and reactive to light. Conjunctivae and EOM are normal. Right eye exhibits no discharge. Left eye exhibits no discharge. No scleral icterus.  Neck: Normal range of motion. Neck supple. No thyromegaly present.  No bruits thyromegaly or anterior cervical adenopathy  Cardiovascular: Normal rate, regular rhythm, normal  heart sounds and intact distal pulses.   No murmur heard. The heart was regular at 72/m  Pulmonary/Chest: Effort normal and breath sounds normal. No respiratory distress. He has no wheezes. He has no rales. He exhibits no tenderness.  No axillary adenopathy and no chest wall masses. The lungs were clear anteriorly and posteriorly.  Abdominal: Soft. Bowel sounds are normal. He exhibits no mass. There is no tenderness. There is no rebound and no guarding.  Abdominal obesity without  masses tenderness or organ enlargement or bruits  Genitourinary: Rectum normal and penis normal.  Genitourinary Comments: The prostate is enlarged but soft and smooth. There are no rectal masses. There were no inguinal hernias present. The testicles were normal.  Musculoskeletal: Normal range of motion. He exhibits no edema or tenderness.  Lymphadenopathy:    He has no cervical adenopathy.  Neurological: He is alert and oriented to person, place, and time. He has normal reflexes. No cranial nerve deficit.  Skin: Skin is warm and dry. No rash noted.  There are several healing areas from recent tick bites on his body sprays he is upper thorax. The skin lesion on the right for head above the right eye is a little atypical and because of this I'll have him see the dermatologist to further evaluate and remove this.  Psychiatric: He has a normal mood and affect. His behavior is normal. Judgment and thought content normal.  Nursing note and vitals reviewed.  BP 123/77 (BP Location: Left Arm)   Pulse 68   Temp 97.9 F (36.6 C) (Oral)   Ht 6' (1.829 m)   Wt 253 lb (114.8 kg)   BMI 34.31 kg/m         Assessment & Plan:  1. Annual physical exam -The patient is up-to-date on his health maintenance issues. - BMP8+EGFR; Future - CBC with Differential/Platelet; Future - Lipid panel; Future - VITAMIN D 25 Hydroxy (Vit-D Deficiency, Fractures); Future - Hepatic function panel; Future - PSA, total and free; Future - Urinalysis, Complete; Future  2. Pure hypercholesterolemia -Continue with aggressive therapeutic lifestyle changes including diet and exercise and with current treatment pending results of lab work - CBC with Differential/Platelet; Future - Lipid panel; Future  3. Essential hypertension -The blood pressure is good today and he will continue with current treatment - BMP8+EGFR; Future - CBC with Differential/Platelet; Future - Hepatic function panel; Future  4. Vitamin D  deficiency -Continue with vitamin D replacement pending results of lab work - CBC with Differential/Platelet; Future - VITAMIN D 25 Hydroxy (Vit-D Deficiency, Fractures); Future  5. Tick bite, sequela -Doxycycline 100 mg twice daily with food for 3 weeks with 1 refill - Rocky mtn spotted fvr abs pnl(IgG+IgM); Future - Lyme Ab/Western Blot Reflex; Future  6. Benign prostatic hyperplasia without lower urinary tract symptoms -The patient does have a large prostate but no prostate symptoms. He has no erectile dysfunction issues.  Meds ordered this encounter  Medications  . doxycycline (VIBRA-TABS) 100 MG tablet    Sig: Take 1 tablet (100 mg total) by mouth 2 (two) times daily. 1 po bid    Dispense:  42 tablet    Refill:  1   Patient Instructions  Continue current medications. Continue good therapeutic lifestyle changes which include good diet and exercise. Fall precautions discussed with patient. If an FOBT was given today- please return it to our front desk. If you are over 84 years old - you may need Prevnar 26 or the adult Pneumonia vaccine.  **  Flu shots are available--- please call and schedule a FLU-CLINIC appointment**  After your visit with [place] today you will receive a survey in the mail or online from [employer] regarding your care with [place]. Please take a moment to fill this out. Your feedback is very important to [place] as you can help [place] better understand your patient needs as well as improve your experience and satisfaction. WE CARE ABOUT YOU!!!   Return the FOBT Continue to follow-up aggressive therapeutic lifestyle changes We will make an appointment for you to see the dermatologist for the skin lesion on your right forehead Make every effort possible to work on weight through diet and exercise Use deep was off and protective clothing when out in the woods or grassy areas    Arrie Senate MD

## 2017-05-13 NOTE — Addendum Note (Signed)
Addended by: Earlene Plater on: 05/13/2017 04:38 PM   Modules accepted: Orders

## 2017-05-13 NOTE — Patient Instructions (Addendum)
Continue current medications. Continue good therapeutic lifestyle changes which include good diet and exercise. Fall precautions discussed with patient. If an FOBT was given today- please return it to our front desk. If you are over 59 years old - you may need Prevnar 42 or the adult Pneumonia vaccine.  **Flu shots are available--- please call and schedule a FLU-CLINIC appointment**  After your visit with [place] today you will receive a survey in the mail or online from [employer] regarding your care with [place]. Please take a moment to fill this out. Your feedback is very important to [place] as you can help [place] better understand your patient needs as well as improve your experience and satisfaction. WE CARE ABOUT YOU!!!   Return the FOBT Continue to follow-up aggressive therapeutic lifestyle changes We will make an appointment for you to see the dermatologist for the skin lesion on your right forehead Make every effort possible to work on weight through diet and exercise Use deep was off and protective clothing when out in the woods or grassy areas

## 2017-05-15 ENCOUNTER — Other Ambulatory Visit: Payer: BLUE CROSS/BLUE SHIELD

## 2017-05-15 DIAGNOSIS — Z0000 Encounter for general adult medical examination without abnormal findings: Secondary | ICD-10-CM

## 2017-05-15 DIAGNOSIS — I10 Essential (primary) hypertension: Secondary | ICD-10-CM | POA: Diagnosis not present

## 2017-05-15 DIAGNOSIS — E559 Vitamin D deficiency, unspecified: Secondary | ICD-10-CM

## 2017-05-15 DIAGNOSIS — E7800 Pure hypercholesterolemia, unspecified: Secondary | ICD-10-CM | POA: Diagnosis not present

## 2017-05-15 DIAGNOSIS — W57XXXS Bitten or stung by nonvenomous insect and other nonvenomous arthropods, sequela: Secondary | ICD-10-CM

## 2017-05-18 LAB — CBC WITH DIFFERENTIAL/PLATELET
Basophils Absolute: 0 10*3/uL (ref 0.0–0.2)
Basos: 0 %
EOS (ABSOLUTE): 0.1 10*3/uL (ref 0.0–0.4)
Eos: 2 %
Hematocrit: 45.4 % (ref 37.5–51.0)
Hemoglobin: 15.2 g/dL (ref 13.0–17.7)
Immature Grans (Abs): 0 10*3/uL (ref 0.0–0.1)
Immature Granulocytes: 0 %
Lymphocytes Absolute: 1.3 10*3/uL (ref 0.7–3.1)
Lymphs: 22 %
MCH: 29.6 pg (ref 26.6–33.0)
MCHC: 33.5 g/dL (ref 31.5–35.7)
MCV: 88 fL (ref 79–97)
Monocytes Absolute: 0.6 10*3/uL (ref 0.1–0.9)
Monocytes: 10 %
Neutrophils Absolute: 4 10*3/uL (ref 1.4–7.0)
Neutrophils: 66 %
Platelets: 202 10*3/uL (ref 150–379)
RBC: 5.14 x10E6/uL (ref 4.14–5.80)
RDW: 14.2 % (ref 12.3–15.4)
WBC: 5.9 10*3/uL (ref 3.4–10.8)

## 2017-05-18 LAB — PSA, TOTAL AND FREE
PSA, Free Pct: 17.1 %
PSA, Free: 0.24 ng/mL
Prostate Specific Ag, Serum: 1.4 ng/mL (ref 0.0–4.0)

## 2017-05-18 LAB — BMP8+EGFR
BUN/Creatinine Ratio: 15 (ref 9–20)
BUN: 15 mg/dL (ref 6–24)
CO2: 21 mmol/L (ref 20–29)
Calcium: 9.3 mg/dL (ref 8.7–10.2)
Chloride: 104 mmol/L (ref 96–106)
Creatinine, Ser: 0.97 mg/dL (ref 0.76–1.27)
GFR calc Af Amer: 99 mL/min/{1.73_m2} (ref >59)
GFR calc non Af Amer: 86 mL/min/{1.73_m2} (ref >59)
Glucose: 116 mg/dL — ABNORMAL HIGH (ref 65–99)
Potassium: 4.9 mmol/L (ref 3.5–5.2)
Sodium: 141 mmol/L (ref 134–144)

## 2017-05-18 LAB — HEPATIC FUNCTION PANEL
ALT: 17 IU/L (ref 0–44)
AST: 23 IU/L (ref 0–40)
Albumin: 4.4 g/dL (ref 3.5–5.5)
Alkaline Phosphatase: 71 IU/L (ref 39–117)
Bilirubin Total: 0.4 mg/dL (ref 0.0–1.2)
Bilirubin, Direct: 0.12 mg/dL (ref 0.00–0.40)
Total Protein: 6.9 g/dL (ref 6.0–8.5)

## 2017-05-18 LAB — LIPID PANEL
Chol/HDL Ratio: 3 ratio (ref 0.0–5.0)
Cholesterol, Total: 134 mg/dL (ref 100–199)
HDL: 45 mg/dL (ref >39)
LDL Calculated: 69 mg/dL (ref 0–99)
Triglycerides: 98 mg/dL (ref 0–149)
VLDL Cholesterol Cal: 20 mg/dL (ref 5–40)

## 2017-05-18 LAB — ROCKY MTN SPOTTED FVR ABS PNL(IGG+IGM)
RMSF IgG: NEGATIVE
RMSF IgM: 0.58 index (ref 0.00–0.89)

## 2017-05-18 LAB — LYME AB/WESTERN BLOT REFLEX
LYME DISEASE AB, QUANT, IGM: <0.8 index (ref 0.00–0.79)
Lyme IgG/IgM Ab: <0.91 {ISR} (ref 0.00–0.90)

## 2017-05-18 LAB — VITAMIN D 25 HYDROXY (VIT D DEFICIENCY, FRACTURES): Vit D, 25-Hydroxy: 49.5 ng/mL (ref 30.0–100.0)

## 2017-06-07 ENCOUNTER — Encounter: Payer: Self-pay | Admitting: Family Medicine

## 2017-06-07 ENCOUNTER — Other Ambulatory Visit: Payer: Self-pay | Admitting: *Deleted

## 2017-06-07 MED ORDER — ROSUVASTATIN CALCIUM 20 MG PO TABS: 20.0000 mg | ORAL_TABLET | Freq: Every day | ORAL | 1 refills | Status: DC

## 2017-06-23 ENCOUNTER — Other Ambulatory Visit: Payer: Self-pay | Admitting: Family Medicine

## 2017-07-11 DIAGNOSIS — Z6833 Body mass index (BMI) 33.0-33.9, adult: Secondary | ICD-10-CM | POA: Diagnosis not present

## 2017-07-11 DIAGNOSIS — H5789 Other specified disorders of eye and adnexa: Secondary | ICD-10-CM | POA: Diagnosis not present

## 2017-09-20 ENCOUNTER — Encounter: Payer: Self-pay | Admitting: Family Medicine

## 2017-09-21 ENCOUNTER — Encounter: Payer: Self-pay | Admitting: Family Medicine

## 2017-09-21 ENCOUNTER — Ambulatory Visit (INDEPENDENT_AMBULATORY_CARE_PROVIDER_SITE_OTHER): Payer: BLUE CROSS/BLUE SHIELD | Admitting: Family Medicine

## 2017-09-21 VITALS — BP 127/90 | HR 84 | Temp 97.2°F | Wt 251.0 lb

## 2017-09-21 DIAGNOSIS — R1013 Epigastric pain: Secondary | ICD-10-CM

## 2017-09-21 MED ORDER — PANTOPRAZOLE SODIUM 40 MG PO TBEC: 40.0000 mg | DELAYED_RELEASE_TABLET | Freq: Every day | ORAL | 0 refills | Status: DC

## 2017-09-21 NOTE — Patient Instructions (Signed)
I suspect that this is a duodenal ulcer/inflammation.  I prescribed you a proton pump inhibitor for acid reduction/neutralization.  Take this 1 time a day for the next 2 weeks.  Plan to follow-up with Dr. Laurance Flatten in 2 weeks for recheck.  If you develop any other worrisome symptoms or signs that we discussed, please seek immediate medical attention.   Food Choices for Gastroesophageal Reflux Disease, Adult When you have gastroesophageal reflux disease (GERD), the foods you eat and your eating habits are very important. Choosing the right foods can help ease your discomfort. What guidelines do I need to follow?  Choose fruits, vegetables, whole grains, and low-fat dairy products.  Choose low-fat meat, fish, and poultry.  Limit fats such as oils, salad dressings, butter, nuts, and avocado.  Keep a food diary. This helps you identify foods that cause symptoms.  Avoid foods that cause symptoms. These may be different for everyone.  Eat small meals often instead of 3 large meals a day.  Eat your meals slowly, in a place where you are relaxed.  Limit fried foods.  Cook foods using methods other than frying.  Avoid drinking alcohol.  Avoid drinking large amounts of liquids with your meals.  Avoid bending over or lying down until 2-3 hours after eating. What foods are not recommended? These are some foods and drinks that may make your symptoms worse: Vegetables Tomatoes. Tomato juice. Tomato and spaghetti sauce. Chili peppers. Onion and garlic. Horseradish. Fruits Oranges, grapefruit, and lemon (fruit and juice). Meats High-fat meats, fish, and poultry. This includes hot dogs, ribs, ham, sausage, salami, and bacon. Dairy Whole milk and chocolate milk. Sour cream. Cream. Butter. Ice cream. Cream cheese. Drinks Coffee and tea. Bubbly (carbonated) drinks or energy drinks. Condiments Hot sauce. Barbecue sauce. Sweets/Desserts Chocolate and cocoa. Donuts. Peppermint and spearmint. Fats  and Oils High-fat foods. This includes [place] fries and potato chips. Other Vinegar. Strong spices. This includes black pepper, white pepper, red pepper, cayenne, curry powder, cloves, ginger, and chili powder. The items listed above may not be a complete list of foods and drinks to avoid. Contact your dietitian for more information. This information is not intended to replace advice given to you by your health care provider. Make sure you discuss any questions you have with your health care provider. Document Released: 02/23/2012 Document Revised: 01/30/2016 Document Reviewed: 06/28/2013 Elsevier Interactive Patient Education  2017 [employer].

## 2017-09-21 NOTE — Progress Notes (Signed)
Subjective: CC: abdominal pain  PCP: Chipper Herb, MD Jeffrey Barnett is a 57 y.o. male presenting to clinic today for:  1. Abdominal pain Patient reports that he has had a "stomachache" for 1 week.  He points to the epigastric area as a source of pain.  He notes that it seems to come and go.  Pain is actually relieved by eating.  He denies associated nausea, vomiting, diarrhea, constipation, melena, hematochezia, fevers, dysuria.  No known undercooked foods or foods that have been left out too long.  Patient is a non-smoker.  He is a social drinker citing that he may be has 1-2 glasses of wine per week with dinner.   ROS: Per HPI  No Known Allergies Past Medical History:  Diagnosis Date  . Fever blister   . Hypertension   . Other and unspecified hyperlipidemia   . Villous adenoma   . Wax in ear     Current Outpatient Medications:  .  Cholecalciferol (VITAMIN D) 2000 UNITS CAPS, Take 1 capsule by mouth daily., Disp: , Rfl:  .  enalapril (VASOTEC) 10 MG tablet, TAKE 1 TABLET (10 MG TOTAL) BY MOUTH DAILY., Disp: 90 tablet, Rfl: 1 .  fluticasone (FLONASE) 50 MCG/ACT nasal spray, Place 2 sprays into both nostrils daily., Disp: 16 g, Rfl: 6 .  Omega-3 Fatty Acids (FISH OIL) 1000 MG CAPS, Take 2 capsules by mouth daily.  , Disp: , Rfl:  .  rosuvastatin (CRESTOR) 20 MG tablet, Take 1 tablet (20 mg total) by mouth daily., Disp: 90 tablet, Rfl: 1 .  sildenafil (REVATIO) 20 MG tablet, TAKE 2-5 TABLETS BY MOUTH AS NEEDED PRIOR TO SEXUAL ACTIVITY., Disp: 50 tablet, Rfl: 1 .  valACYclovir (VALTREX) 1000 MG tablet, TAKE 1 TABLET EVERY DAY AS DIRECTED (Patient not taking: Reported on 09/21/2017), Disp: 30 tablet, Rfl: 1 Social History   Socioeconomic History  . Marital status: Married    Spouse name: Not on file  . Number of children: Not on file  . Years of education: Not on file  . Highest education level: Not on file  Social Needs  . Financial resource strain: Not on file  . Food  insecurity - worry: Not on file  . Food insecurity - inability: Not on file  . Transportation needs - medical: Not on file  . Transportation needs - non-medical: Not on file  Occupational History  . Not on file  Tobacco Use  . Smoking status: Never Smoker  . Smokeless tobacco: Never Used  Substance and Sexual Activity  . Alcohol use: No  . Drug use: No  . Sexual activity: Not on file  Other Topics Concern  . Not on file  Social History Narrative  . Not on file   Family History  Problem Relation Age of Onset  . Dementia Mother   . Thyroid disease Father     Objective: Office vital signs reviewed. BP 127/90   Pulse 84   Temp (!) 97.2 F (36.2 C)   Wt 251 lb (113.9 kg)   BMI 34.04 kg/m   Physical Examination:  General: Awake, alert, well nourished, well appearing male, No acute distress GI: soft, mild epigastric TTP, non-distended, bowel sounds present x4, no hepatomegaly, no splenomegaly, no masses  Assessment/ Plan: 57 y.o. male   1. Epigastric pain I suspect duodenal ulcer versus duodenitis.  No evidence of infection.  Nothing to suggest pancreatitis, cholecystitis or appendicitis.  No red flags.  Patient is well-appearing with normal vitals.  We will treat with 2-week trial of PPI.  Home care instructions were reviewed and a handout was provided.  Patient to follow-up in 2 weeks.  If persistent symptoms, will consider obtaining labs and referral to GI for further evaluation.  Strict return precautions and reasons for emergent evaluation in the emergency department review with patient.  He voiced understanding and will follow-up as needed.   Meds ordered this encounter  Medications  . pantoprazole (PROTONIX) 40 MG tablet    Sig: Take 1 tablet (40 mg total) by mouth daily.    Dispense:  30 tablet    Refill:  Lucas, DO [place] [phone]

## 2017-10-18 ENCOUNTER — Other Ambulatory Visit: Payer: Self-pay | Admitting: Family Medicine

## 2017-10-18 ENCOUNTER — Ambulatory Visit (INDEPENDENT_AMBULATORY_CARE_PROVIDER_SITE_OTHER): Payer: BLUE CROSS/BLUE SHIELD | Admitting: Family Medicine

## 2017-10-18 ENCOUNTER — Encounter: Payer: Self-pay | Admitting: Family Medicine

## 2017-10-18 VITALS — BP 124/75 | HR 77 | Temp 97.8°F | Ht 72.0 in | Wt 255.0 lb

## 2017-10-18 DIAGNOSIS — R1013 Epigastric pain: Secondary | ICD-10-CM | POA: Diagnosis not present

## 2017-10-18 NOTE — Patient Instructions (Signed)
Continue to limit the intake of wine or beer. Continue to avoid irritating substances like fried spicy foods and acidic foods Continue with the Protonix for at least 1 month be on your current prescription and then switch over to ranitidine 150 mg twice daily before breakfast and supper to your regular appointment with me in March Return the FOBT We will call with the CBC results as soon as those results become available

## 2017-10-18 NOTE — Progress Notes (Signed)
Subjective:    Patient ID: Jeffrey Barnett, male    DOB: Jan 12, 1961, 57 y.o.   MRN: [id number]  HPI Patient here today for follow up from a visit for epigastric pain.  The patient is some better.  There was some concern about a duodenal ulcer.  We will get a CBC today and give him an FOBT to return.  The Protonix has helped this so far.  The visit was on 15 January so he has been on this for about a month.  The patient has been through a lot recently and that he lost his father and has been stressed a lot with that, even though he does not really admit to that.  He is feeling better and if the pain was 10 when he was seen initially is now down to at the most.  He denies any chest pain or shortness of breath.  He denies any change in bowel habits including nausea vomiting diarrhea blood in the stool or black tarry bowel movements and says he is passing his water without problems.  He is tried to limit his intake of wine and irritating foods.    Patient Active Problem List   Diagnosis Date Noted  . Hypertension 04/03/2013  . Fever blister, history of   . Hyperlipidemia   . Villous adenoma, history of    Outpatient Encounter Medications as of 10/18/2017  Medication Sig  . Cholecalciferol (VITAMIN D) 2000 UNITS CAPS Take 1 capsule by mouth daily.  . enalapril (VASOTEC) 10 MG tablet TAKE 1 TABLET (10 MG TOTAL) BY MOUTH DAILY.  . fluticasone (FLONASE) 50 MCG/ACT nasal spray Place 2 sprays into both nostrils daily.  . Omega-3 Fatty Acids (FISH OIL) 1000 MG CAPS Take 2 capsules by mouth daily.    . pantoprazole (PROTONIX) 40 MG tablet TAKE 1 TABLET BY MOUTH EVERY DAY  . rosuvastatin (CRESTOR) 20 MG tablet Take 1 tablet (20 mg total) by mouth daily.  . sildenafil (REVATIO) 20 MG tablet TAKE 2-5 TABLETS BY MOUTH AS NEEDED PRIOR TO SEXUAL ACTIVITY.  Marland Kitchen valACYclovir (VALTREX) 1000 MG tablet TAKE 1 TABLET EVERY DAY AS DIRECTED   No facility-administered encounter medications on file as of 10/18/2017.       Review of Systems  Constitutional: Negative.   HENT: Negative.   Eyes: Negative.   Respiratory: Negative.   Cardiovascular: Negative.   Gastrointestinal: Negative.        GERD - better   Endocrine: Negative.   Genitourinary: Negative.   Musculoskeletal: Negative.   Skin: Negative.   Allergic/Immunologic: Negative.   Neurological: Negative.   Hematological: Negative.   Psychiatric/Behavioral: Negative.        Objective:   Physical Exam  Constitutional: He is oriented to person, place, and time. He appears well-developed and well-nourished. No distress.  HENT:  Head: Normocephalic and atraumatic.  Eyes: Conjunctivae and EOM are normal. Pupils are equal, round, and reactive to light. Right eye exhibits no discharge. Left eye exhibits no discharge. No scleral icterus.  Neck: Normal range of motion.  Cardiovascular: Normal rate, regular rhythm and normal heart sounds.  No murmur heard. Pulmonary/Chest: Effort normal and breath sounds normal. No respiratory distress. He has no wheezes. He has no rales.  Clear anteriorly and posteriorly  Abdominal: Soft. Bowel sounds are normal. He exhibits no mass. There is no tenderness. There is no rebound and no guarding.  Musculoskeletal: Normal range of motion.  Neurological: He is alert and oriented to person, place, and time.  Skin: Skin is warm and dry. No rash noted.  Psychiatric: He has a normal mood and affect. His behavior is normal. Judgment and thought content normal.  Nursing note and vitals reviewed.   BP 124/75 (BP Location: Left Arm)   Pulse 77   Temp 97.8 F (36.6 C) (Oral)   Ht 6' (1.829 m)   Wt 255 lb (115.7 kg)   BMI 34.58 kg/m        Assessment & Plan:  1. Epigastric pain---this has improved -The patient will continue with his Protonix for 1 month to be on his current prescription and then change over to ranitidine 150 mg twice daily prior to his next visit to see me - CBC with Differential/Platelet -  Fecal occult blood, imunochemical; Future  No orders of the defined types were placed in this encounter.  Patient Instructions  Continue to limit the intake of wine or beer. Continue to avoid irritating substances like fried spicy foods and acidic foods Continue with the Protonix for at least 1 month be on your current prescription and then switch over to ranitidine 150 mg twice daily before breakfast and supper to your regular appointment with me in March Return the FOBT We will call with the CBC results as soon as those results become available  Arrie Senate MD

## 2017-10-19 LAB — CBC WITH DIFFERENTIAL/PLATELET
Basophils Absolute: 0 10*3/uL (ref 0.0–0.2)
Basos: 1 %
EOS (ABSOLUTE): 0.1 10*3/uL (ref 0.0–0.4)
Eos: 1 %
Hematocrit: 43.7 % (ref 37.5–51.0)
Hemoglobin: 15 g/dL (ref 13.0–17.7)
Immature Grans (Abs): 0 10*3/uL (ref 0.0–0.1)
Immature Granulocytes: 0 %
Lymphocytes Absolute: 1.9 10*3/uL (ref 0.7–3.1)
Lymphs: 31 %
MCH: 29.6 pg (ref 26.6–33.0)
MCHC: 34.3 g/dL (ref 31.5–35.7)
MCV: 86 fL (ref 79–97)
Monocytes Absolute: 0.5 10*3/uL (ref 0.1–0.9)
Monocytes: 8 %
Neutrophils Absolute: 3.5 10*3/uL (ref 1.4–7.0)
Neutrophils: 59 %
Platelets: 213 10*3/uL (ref 150–379)
RBC: 5.07 x10E6/uL (ref 4.14–5.80)
RDW: 13.7 % (ref 12.3–15.4)
WBC: 6 10*3/uL (ref 3.4–10.8)

## 2017-10-26 ENCOUNTER — Other Ambulatory Visit: Payer: Self-pay | Admitting: Family Medicine

## 2017-11-10 ENCOUNTER — Encounter: Payer: Self-pay | Admitting: Family Medicine

## 2017-11-10 ENCOUNTER — Ambulatory Visit (INDEPENDENT_AMBULATORY_CARE_PROVIDER_SITE_OTHER): Payer: BLUE CROSS/BLUE SHIELD | Admitting: Family Medicine

## 2017-11-10 VITALS — BP 121/77 | HR 63 | Temp 97.1°F | Ht 72.0 in | Wt 254.0 lb

## 2017-11-10 DIAGNOSIS — E7800 Pure hypercholesterolemia, unspecified: Secondary | ICD-10-CM | POA: Diagnosis not present

## 2017-11-10 DIAGNOSIS — R1013 Epigastric pain: Secondary | ICD-10-CM | POA: Diagnosis not present

## 2017-11-10 DIAGNOSIS — E559 Vitamin D deficiency, unspecified: Secondary | ICD-10-CM | POA: Diagnosis not present

## 2017-11-10 DIAGNOSIS — I10 Essential (primary) hypertension: Secondary | ICD-10-CM

## 2017-11-10 DIAGNOSIS — N400 Enlarged prostate without lower urinary tract symptoms: Secondary | ICD-10-CM

## 2017-11-10 NOTE — Progress Notes (Signed)
Subjective:    Patient ID: Jeffrey Barnett, male    DOB: [birth year], 57 y.o.   MRN: [id number]  HPI Pt here for follow up and management of chronic medical problems which includes hyperlipidemia and hypertension. He is taking medication regularly.  The patient is doing better with the recent gastrointestinal discomfort saying that the Protonix helps this.  He will return to the office to get fasting lab work.  He has an FOBT at home that he needs to return and he plans to do this.  His body mass index is 34.58.  The patient denies any chest pain pressure tightness or shortness of breath.  He has been cutting some wood recently and other than back pain tolerated that procedure are activity well.  He denies any trouble with nausea vomiting heartburn change in bowel habits blood in the stool or black tarry bowel movements.  He is passing his water without problems.  He had a stress test in the fall 2017 that was done for routine purposes.  Better with his stomach he will finish the Protonix and then try some Zantac for about a month and then try to stop taking the medicine altogether after that period of time.    Patient Active Problem List   Diagnosis Date Noted  . Hypertension 04/03/2013  . Fever blister, history of   . Hyperlipidemia   . Villous adenoma, history of    Outpatient Encounter Medications as of 11/10/2017  Medication Sig  . Cholecalciferol (VITAMIN D) 2000 UNITS CAPS Take 1 capsule by mouth daily.  . enalapril (VASOTEC) 10 MG tablet TAKE 1 TABLET (10 MG TOTAL) BY MOUTH DAILY.  . fluticasone (FLONASE) 50 MCG/ACT nasal spray Place 2 sprays into both nostrils daily.  . Omega-3 Fatty Acids (FISH OIL) 1000 MG CAPS Take 2 capsules by mouth daily.    . pantoprazole (PROTONIX) 40 MG tablet TAKE 1 TABLET BY MOUTH EVERY DAY  . rosuvastatin (CRESTOR) 20 MG tablet Take 1 tablet (20 mg total) by mouth daily.  . sildenafil (REVATIO) 20 MG tablet TAKE 2-5 TABLETS BY MOUTH AS NEEDED PRIOR TO SEXUAL  ACTIVITY.  Marland Kitchen valACYclovir (VALTREX) 1000 MG tablet TAKE 1 TABLET EVERY DAY AS DIRECTED   No facility-administered encounter medications on file as of 11/10/2017.       Review of Systems  Constitutional: Negative.   HENT: Negative.   Eyes: Negative.   Respiratory: Negative.   Cardiovascular: Negative.   Gastrointestinal: Negative.        Heartburn better - protonix helps  Endocrine: Negative.   Genitourinary: Negative.   Musculoskeletal: Negative.   Skin: Negative.   Allergic/Immunologic: Negative.   Neurological: Negative.   Hematological: Negative.   Psychiatric/Behavioral: Negative.        Objective:   Physical Exam  Constitutional: He is oriented to person, place, and time. He appears well-developed and well-nourished. No distress.  Patient is pleasant and feeling better.  HENT:  Head: Normocephalic and atraumatic.  Right Ear: External ear normal.  Left Ear: External ear normal.  Mouth/Throat: Oropharynx is clear and moist. No oropharyngeal exudate.  Nasal turbinate congestion bilaterally  Eyes: Conjunctivae and EOM are normal. Pupils are equal, round, and reactive to light. Right eye exhibits no discharge. Left eye exhibits no discharge. No scleral icterus.  Neck: Normal range of motion. Neck supple. No thyromegaly present.  No bruits thyromegaly or anterior cervical adenopathy  Cardiovascular: Normal rate, regular rhythm, normal heart sounds and intact distal pulses.  No murmur  heard. Heart is regular at 60/min  Pulmonary/Chest: Effort normal and breath sounds normal. No respiratory distress. He has no wheezes. He has no rales. He exhibits no tenderness.  No axillary adenopathy and chest is clear anteriorly and posteriorly  Abdominal: Soft. Bowel sounds are normal. He exhibits no mass. There is no tenderness. There is no rebound and no guarding.  No epigastric tenderness liver or spleen enlargement bruits or masses or inguinal adenopathy.  Musculoskeletal: Normal  range of motion. He exhibits no edema.  Lymphadenopathy:    He has no cervical adenopathy.  Neurological: He is alert and oriented to person, place, and time. He has normal reflexes. No cranial nerve deficit.  Skin: Skin is warm and dry. No rash noted.  Psychiatric: He has a normal mood and affect. His behavior is normal. Judgment and thought content normal.  Nursing note and vitals reviewed.  BP 121/77 (BP Location: Left Arm)   Pulse 63   Temp (!) 97.1 F (36.2 C) (Oral)   Ht 6' (1.829 m)   Wt 254 lb (115.2 kg)   BMI 34.45 kg/m         Assessment & Plan:  1. Pure hypercholesterolemia -Continue with current treatment and aggressive therapeutic lifestyle changes pending results of lab work - CBC with Differential/Platelet; Future - Lipid panel; Future  2. Essential hypertension -The blood pressure is good today and he will continue with his current treatment - BMP8+EGFR; Future - CBC with Differential/Platelet; Future - Hepatic function panel; Future  3. Vitamin D deficiency -Continue with vitamin D replacement pending results of lab work - CBC with Differential/Platelet; Future - VITAMIN D 25 Hydroxy (Vit-D Deficiency, Fractures); Future  4. Benign prostatic hyperplasia without lower urinary tract symptoms -No complaints with voiding today. - CBC with Differential/Platelet; Future  5. Epigastric pain -Take Protonix for 4 more weeks and then switch over to ranitidine for 4 weeks twice daily and then discontinue both medicines altogether and take only if needed. -Return the FOBT  Patient Instructions  Continue current medications. Continue good therapeutic lifestyle changes which include good diet and exercise. Fall precautions discussed with patient. If an FOBT was given today- please return it to our front desk. If you are over 69 years old - you may need Prevnar 21 or the adult Pneumonia vaccine.  **Flu shots are available--- please call and schedule a FLU-CLINIC  appointment**  After your visit with [place] today you will receive a survey in the mail or online from [employer] regarding your care with [place]. Please take a moment to fill this out. Your feedback is very important to [place] as you can help [place] better understand your patient needs as well as improve your experience and satisfaction. WE CARE ABOUT YOU!!!   Return the FOBT Make every effort to walk and exercise and watch diet and lose weight The patient did have a ETT done in this office in 2017 and will need another one in 4-5 years from that date for routine purposes He should continue to drink plenty of fluids and stay well-hydrated He can take the Protonix for another month and after that period of time change over to ranitidine 150 mg twice daily before breakfast and supper for month and if he is still doing well can take either 1 of the medicines only if needed after that period of time. He should continue to watch his caffeine intake and irritating foods on the stomach.   Arrie Senate MD

## 2017-11-10 NOTE — Patient Instructions (Addendum)
Continue current medications. Continue good therapeutic lifestyle changes which include good diet and exercise. Fall precautions discussed with patient. If an FOBT was given today- please return it to our front desk. If you are over 59 years old - you may need Prevnar 77 or the adult Pneumonia vaccine.  **Flu shots are available--- please call and schedule a FLU-CLINIC appointment**  After your visit with [place] today you will receive a survey in the mail or online from [employer] regarding your care with [place]. Please take a moment to fill this out. Your feedback is very important to [place] as you can help [place] better understand your patient needs as well as improve your experience and satisfaction. WE CARE ABOUT YOU!!!   Return the FOBT Make every effort to walk and exercise and watch diet and lose weight The patient did have a ETT done in this office in 2017 and will need another one in 4-5 years from that date for routine purposes He should continue to drink plenty of fluids and stay well-hydrated He can take the Protonix for another month and after that period of time change over to ranitidine 150 mg twice daily before breakfast and supper for month and if he is still doing well can take either 1 of the medicines only if needed after that period of time. He should continue to watch his caffeine intake and irritating foods on the stomach.

## 2017-11-13 ENCOUNTER — Other Ambulatory Visit: Payer: BLUE CROSS/BLUE SHIELD

## 2017-11-13 DIAGNOSIS — N400 Enlarged prostate without lower urinary tract symptoms: Secondary | ICD-10-CM | POA: Diagnosis not present

## 2017-11-13 DIAGNOSIS — E559 Vitamin D deficiency, unspecified: Secondary | ICD-10-CM

## 2017-11-13 DIAGNOSIS — E7800 Pure hypercholesterolemia, unspecified: Secondary | ICD-10-CM

## 2017-11-13 DIAGNOSIS — I10 Essential (primary) hypertension: Secondary | ICD-10-CM | POA: Diagnosis not present

## 2017-11-15 LAB — CBC WITH DIFFERENTIAL/PLATELET
Basophils Absolute: 0 10*3/uL (ref 0.0–0.2)
Basos: 0 %
EOS (ABSOLUTE): 0.1 10*3/uL (ref 0.0–0.4)
Eos: 1 %
Hematocrit: 45.7 % (ref 37.5–51.0)
Hemoglobin: 15.4 g/dL (ref 13.0–17.7)
Immature Grans (Abs): 0 10*3/uL (ref 0.0–0.1)
Immature Granulocytes: 0 %
Lymphocytes Absolute: 1.3 10*3/uL (ref 0.7–3.1)
Lymphs: 23 %
MCH: 29.1 pg (ref 26.6–33.0)
MCHC: 33.7 g/dL (ref 31.5–35.7)
MCV: 86 fL (ref 79–97)
Monocytes Absolute: 0.5 10*3/uL (ref 0.1–0.9)
Monocytes: 9 %
Neutrophils Absolute: 3.9 10*3/uL (ref 1.4–7.0)
Neutrophils: 67 %
Platelets: 227 10*3/uL (ref 150–379)
RBC: 5.29 x10E6/uL (ref 4.14–5.80)
RDW: 13.4 % (ref 12.3–15.4)
WBC: 5.9 10*3/uL (ref 3.4–10.8)

## 2017-11-15 LAB — HEPATIC FUNCTION PANEL
ALT: 25 IU/L (ref 0–44)
AST: 30 IU/L (ref 0–40)
Albumin: 4.6 g/dL (ref 3.5–5.5)
Alkaline Phosphatase: 71 IU/L (ref 39–117)
Bilirubin Total: 0.5 mg/dL (ref 0.0–1.2)
Bilirubin, Direct: 0.16 mg/dL (ref 0.00–0.40)
Total Protein: 7 g/dL (ref 6.0–8.5)

## 2017-11-15 LAB — BMP8+EGFR
BUN/Creatinine Ratio: 13 (ref 9–20)
BUN: 14 mg/dL (ref 6–24)
CO2: 24 mmol/L (ref 20–29)
Calcium: 9.4 mg/dL (ref 8.7–10.2)
Chloride: 103 mmol/L (ref 96–106)
Creatinine, Ser: 1.05 mg/dL (ref 0.76–1.27)
GFR calc Af Amer: 89 mL/min/{1.73_m2} (ref >59)
GFR calc non Af Amer: 77 mL/min/{1.73_m2} (ref >59)
Glucose: 116 mg/dL — ABNORMAL HIGH (ref 65–99)
Potassium: 5.1 mmol/L (ref 3.5–5.2)
Sodium: 141 mmol/L (ref 134–144)

## 2017-11-15 LAB — LIPID PANEL
Chol/HDL Ratio: 2.7 ratio (ref 0.0–5.0)
Cholesterol, Total: 120 mg/dL (ref 100–199)
HDL: 45 mg/dL (ref >39)
LDL Calculated: 59 mg/dL (ref 0–99)
Triglycerides: 82 mg/dL (ref 0–149)
VLDL Cholesterol Cal: 16 mg/dL (ref 5–40)

## 2017-11-15 LAB — VITAMIN D 25 HYDROXY (VIT D DEFICIENCY, FRACTURES): Vit D, 25-Hydroxy: 44.9 ng/mL (ref 30.0–100.0)

## 2018-01-11 ENCOUNTER — Other Ambulatory Visit: Payer: Self-pay | Admitting: Family Medicine

## 2018-01-24 ENCOUNTER — Encounter: Payer: Self-pay | Admitting: Family Medicine

## 2018-01-26 ENCOUNTER — Encounter: Payer: Self-pay | Admitting: Physician Assistant

## 2018-01-26 ENCOUNTER — Ambulatory Visit (INDEPENDENT_AMBULATORY_CARE_PROVIDER_SITE_OTHER): Payer: BLUE CROSS/BLUE SHIELD | Admitting: Physician Assistant

## 2018-01-26 VITALS — BP 122/74 | HR 73 | Temp 97.3°F | Ht 72.0 in | Wt 252.4 lb

## 2018-01-26 DIAGNOSIS — R109 Unspecified abdominal pain: Secondary | ICD-10-CM | POA: Diagnosis not present

## 2018-01-26 DIAGNOSIS — N451 Epididymitis: Secondary | ICD-10-CM | POA: Diagnosis not present

## 2018-01-26 LAB — URINALYSIS, COMPLETE
Bilirubin, UA: NEGATIVE
Glucose, UA: NEGATIVE
Ketones, UA: NEGATIVE
Leukocytes, UA: NEGATIVE
Nitrite, UA: NEGATIVE
Protein, UA: NEGATIVE
RBC, UA: NEGATIVE
Specific Gravity, UA: 1.02 (ref 1.005–1.030)
Urobilinogen, Ur: 0.2 mg/dL (ref 0.2–1.0)
pH, UA: 6 (ref 5.0–7.5)

## 2018-01-26 LAB — MICROSCOPIC EXAMINATION
Bacteria, UA: NONE SEEN
Epithelial Cells (non renal): NONE SEEN /hpf (ref 0–10)
RBC, UA: NONE SEEN /hpf (ref 0–2)
Renal Epithel, UA: NONE SEEN /hpf
WBC, UA: NONE SEEN /hpf (ref 0–5)

## 2018-01-26 NOTE — Progress Notes (Signed)
  Subjective:     Patient ID: Jeffrey Barnett, male   DOB: May 16, 1961, 57 y.o.   MRN: [id number]  HPI Pt with vague R testicular pain x 2 weeks with some radiation to the R flank area Concerned about possible infection/stone  Review of Systems  Constitutional: Negative.   Gastrointestinal: Negative.   Genitourinary: Positive for flank pain and testicular pain. Negative for decreased urine volume, difficulty urinating, discharge, dysuria, enuresis, frequency, hematuria, penile pain, penile swelling, scrotal swelling and urgency.       Objective:   Physical Exam  Constitutional: He appears well-developed and well-nourished.  Abdominal: Hernia confirmed negative in the right inguinal area and confirmed negative in the left inguinal area.  Genitourinary: Penis normal. Right testis shows tenderness. Right testis shows no mass and no swelling. Right testis is descended. Left testis shows no mass, no swelling and no tenderness. Left testis is descended. Circumcised.  Genitourinary Comments: Tender to palp at R epydidy.  Lymphadenopathy: No inguinal adenopathy noted on the right or left side.  Nursing note and vitals reviewed. UA- nl ( see labs)     Assessment:     1. Flank pain   2. Epididymitis        Plan:     Heat/Ice prn OTC NSAID'S Good support to the scrotum testes Nl course reviewed Discussed if sx continue may do a trial of ATB F/U prn

## 2018-01-26 NOTE — Patient Instructions (Signed)
Epididymitis Epididymitis is swelling (inflammation) of the epididymis. The epididymis is a cord-like structure that is located along the top and back part of the testicle. It collects and stores sperm from the testicle. This condition can also cause pain and swelling of the testicle and scrotum. Symptoms usually start suddenly (acute epididymitis). Sometimes epididymitis starts gradually and lasts for a while (chronic epididymitis). This type may be harder to treat. What are the causes? In men 35 and younger, this condition is usually caused by a bacterial infection or sexually transmitted disease (STD), such as:  Gonorrhea.  Chlamydia.  In men 35 and older who do not have anal sex, this condition is usually caused by bacteria from a blockage or abnormalities in the urinary system. These can result from:  Having a tube placed into the bladder (urinary catheter).  Having an enlarged or inflamed prostate gland.  Having recent urinary tract surgery.  In men who have a condition that weakens the body's defense system (immune system), such as HIV, this condition can be caused by:  Other bacteria, including tuberculosis and syphilis.  Viruses.  Fungi.  Sometimes this condition occurs without infection. That may happen if urine flows backward into the epididymis after heavy lifting or straining. What increases the risk? This condition is more likely to develop in men:  Who have unprotected sex with more than one partner.  Who have anal sex.  Who have recently had surgery.  Who have a urinary catheter.  Who have urinary problems.  Who have a suppressed immune system.  What are the signs or symptoms? This condition usually begins suddenly with chills, fever, and pain behind the scrotum and in the testicle. Other symptoms include:  Swelling of the scrotum, testicle, or both.  Pain whenejaculatingor urinating.  Pain in the back or belly.  Nausea.  Itching and discharge  from the penis.  Frequent need to pass urine.  Redness and tenderness of the scrotum.  How is this diagnosed? Your health care provider can diagnose this condition based on your symptoms and medical history. Your health care provider will also do a physical exam to ask about your symptoms and check your scrotum and testicle for swelling, pain, and redness. You may also have other tests, including:  Examination of discharge from the penis.  Urine tests for infections, such as STDs.  Your health care provider may test you for other STDs, including HIV. How is this treated? Treatment for this condition depends on the cause. If your condition is caused by a bacterial infection, oral antibiotic medicine may be prescribed. If the bacterial infection has spread to your blood, you may need to receive IV antibiotics. Nonbacterial epididymitis is treated with home care that includes bed rest and elevation of the scrotum. Surgery may be needed to treat:  Bacterial epididymitis that causes pus to build up in the scrotum (abscess).  Chronic epididymitis that has not responded to other treatments.  Follow these instructions at home: Medicines  Take over-the-counter and prescription medicines only as told by your health care provider.  If you were prescribed an antibiotic medicine, take it as told by your health care provider. Do not stop taking the antibiotic even if your condition improves. Sexual Activity  If your epididymitis was caused by an STD, avoid sexual activity until your treatment is complete.  Inform your sexual partner or partners if you test positive for an STD. They may need to be treated.Do not engage in sexual activity with your partner or   partners until their treatment is completed. General instructions  Return to your normal activities as told by your health care provider. Ask your health care provider what activities are safe for you.  Keep your scrotum elevated and  supported while resting. Ask your health care provider if you should wear a scrotal support, such as a jockstrap. Wear it as told by your health care provider.  If directed, apply ice to the affected area: ? Put ice in a plastic bag. ? Place a towel between your skin and the bag. ? Leave the ice on for 20 minutes, 2-3 times per day.  Try taking a sitz bath to help with discomfort. This is a warm water bath that is taken while you are sitting down. The water should only come up to your hips and should cover your buttocks. Do this 3-4 times per day or as told by your health care provider.  Keep all follow-up visits as told by your health care provider. This is important. Contact a health care provider if:  You have a fever.  Your pain medicine is not helping.  Your pain is getting worse.  Your symptoms do not improve within three days. This information is not intended to replace advice given to you by your health care provider. Make sure you discuss any questions you have with your health care provider. Document Released: 08/21/2000 Document Revised: 01/30/2016 Document Reviewed: 01/09/2015 Elsevier Interactive Patient Education  2018 [employer]  

## 2018-02-23 ENCOUNTER — Encounter: Payer: Self-pay | Admitting: Family Medicine

## 2018-02-23 ENCOUNTER — Other Ambulatory Visit: Payer: Self-pay | Admitting: *Deleted

## 2018-02-23 MED ORDER — VALACYCLOVIR HCL 1 G PO TABS: ORAL_TABLET | ORAL | 1 refills | Status: DC

## 2018-03-03 ENCOUNTER — Other Ambulatory Visit: Payer: Self-pay | Admitting: *Deleted

## 2018-03-03 ENCOUNTER — Encounter: Payer: Self-pay | Admitting: Physician Assistant

## 2018-03-03 MED ORDER — CEPHALEXIN 500 MG PO CAPS: 500.0000 mg | ORAL_CAPSULE | Freq: Four times a day (QID) | ORAL | 0 refills | Status: DC

## 2018-03-03 NOTE — Telephone Encounter (Signed)
Please address as back up provider for [place].

## 2018-03-15 ENCOUNTER — Other Ambulatory Visit: Payer: Self-pay | Admitting: Family Medicine

## 2018-03-25 ENCOUNTER — Encounter: Payer: Self-pay | Admitting: Family Medicine

## 2018-03-26 ENCOUNTER — Encounter: Payer: Self-pay | Admitting: Family Medicine

## 2018-03-26 ENCOUNTER — Ambulatory Visit (INDEPENDENT_AMBULATORY_CARE_PROVIDER_SITE_OTHER): Payer: BLUE CROSS/BLUE SHIELD | Admitting: Family Medicine

## 2018-03-26 VITALS — BP 117/87 | HR 76 | Temp 97.0°F | Ht 72.0 in | Wt 252.0 lb

## 2018-03-26 DIAGNOSIS — N50819 Testicular pain, unspecified: Secondary | ICD-10-CM | POA: Diagnosis not present

## 2018-03-26 MED ORDER — CIPROFLOXACIN HCL 500 MG PO TABS
500.0000 mg | ORAL_TABLET | Freq: Two times a day (BID) | ORAL | 0 refills | Status: DC
Start: 2018-03-26 — End: 2018-05-13

## 2018-03-26 NOTE — Progress Notes (Signed)
   HPI  Patient presents today with right testicle pain.  Patient explains he has had it for 6 to 8 weeks.  He describes it as a nagging testicle pain that is worse with sitting.  He denies any pain with bearing down or stooling.  Patient denies any dysuria.  He was given a course of Keflex for this previously and states that he improved slightly and then had resumption of symptoms.  No fever, chills, sweats.  PMH: Smoking status noted ROS: Per HPI  Objective: BP 117/87   Pulse 76   Temp (!) 97 F (36.1 C) (Oral)   Ht 6' (1.829 m)   Wt 252 lb (114.3 kg)   BMI 34.18 kg/m  Gen: NAD, alert, cooperative with exam HEENT: NCAT CV: RRR, good S1/S2, no murmur Resp: CTABL, no wheezes, non-labored Ext: No edema, warm Neuro: Alert and oriented, No gross deficits GU:  Circumcised penis, no significant testicular pain, no inguinal lymphadenopathy No inguinal hernia palpated  Assessment and plan:  #Testicle pain Right-sided with radiation to the right flank Urinalysis pending today Urine culture, last UA was not revealing for etiology  refer to urology    Orders Placed This Encounter  Procedures  . Urine Culture  . Urinalysis, Complete  . Ambulatory referral to Urology    Referral Priority:   Routine    Referral Type:   Consultation    Referral Reason:   Specialty Services Required    Requested Specialty:   Urology    Number of Visits Requested:   1    Meds ordered this encounter  Medications  . ciprofloxacin (CIPRO) 500 MG tablet    Sig: Take 1 tablet (500 mg total) by mouth 2 (two) times daily.    Dispense:  20 tablet    Refill:  0    Laroy Apple, MD [facility] 03/26/2018, 8:23 AM

## 2018-03-26 NOTE — Patient Instructions (Signed)
Great to meet you! 

## 2018-03-27 LAB — URINE CULTURE

## 2018-03-28 ENCOUNTER — Other Ambulatory Visit: Payer: Self-pay | Admitting: Family Medicine

## 2018-03-28 LAB — MICROSCOPIC EXAMINATION
Bacteria, UA: NONE SEEN
Epithelial Cells (non renal): NONE SEEN /hpf (ref 0–10)
RBC, UA: NONE SEEN /hpf (ref 0–2)
Renal Epithel, UA: NONE SEEN /hpf
WBC, UA: NONE SEEN /hpf (ref 0–5)

## 2018-03-28 LAB — URINALYSIS, COMPLETE
Bilirubin, UA: NEGATIVE
Glucose, UA: NEGATIVE
Ketones, UA: NEGATIVE
Leukocytes, UA: NEGATIVE
Nitrite, UA: NEGATIVE
Protein, UA: NEGATIVE
RBC, UA: NEGATIVE
Specific Gravity, UA: 1.025 (ref 1.005–1.030)
Urobilinogen, Ur: 0.2 mg/dL (ref 0.2–1.0)
pH, UA: 5.5 (ref 5.0–7.5)

## 2018-05-03 DIAGNOSIS — N509 Disorder of male genital organs, unspecified: Secondary | ICD-10-CM | POA: Diagnosis not present

## 2018-05-13 ENCOUNTER — Ambulatory Visit (INDEPENDENT_AMBULATORY_CARE_PROVIDER_SITE_OTHER): Payer: BLUE CROSS/BLUE SHIELD | Admitting: Family Medicine

## 2018-05-13 ENCOUNTER — Encounter: Payer: Self-pay | Admitting: Family Medicine

## 2018-05-13 ENCOUNTER — Ambulatory Visit (INDEPENDENT_AMBULATORY_CARE_PROVIDER_SITE_OTHER): Payer: BLUE CROSS/BLUE SHIELD

## 2018-05-13 VITALS — BP 110/73 | HR 81 | Temp 97.6°F | Ht 72.0 in | Wt 256.0 lb

## 2018-05-13 DIAGNOSIS — Z6834 Body mass index (BMI) 34.0-34.9, adult: Secondary | ICD-10-CM | POA: Diagnosis not present

## 2018-05-13 DIAGNOSIS — R1013 Epigastric pain: Secondary | ICD-10-CM

## 2018-05-13 DIAGNOSIS — I10 Essential (primary) hypertension: Secondary | ICD-10-CM

## 2018-05-13 DIAGNOSIS — Z0000 Encounter for general adult medical examination without abnormal findings: Secondary | ICD-10-CM | POA: Diagnosis not present

## 2018-05-13 DIAGNOSIS — E7800 Pure hypercholesterolemia, unspecified: Secondary | ICD-10-CM

## 2018-05-13 DIAGNOSIS — K640 First degree hemorrhoids: Secondary | ICD-10-CM

## 2018-05-13 DIAGNOSIS — N400 Enlarged prostate without lower urinary tract symptoms: Secondary | ICD-10-CM

## 2018-05-13 DIAGNOSIS — N451 Epididymitis: Secondary | ICD-10-CM

## 2018-05-13 DIAGNOSIS — E559 Vitamin D deficiency, unspecified: Secondary | ICD-10-CM

## 2018-05-13 MED ORDER — HYDROCORTISONE 2.5 % RE CREA: 1.0000 "application " | TOPICAL_CREAM | Freq: Every day | RECTAL | 0 refills | Status: DC

## 2018-05-13 NOTE — Patient Instructions (Addendum)
Continue current medications. Continue good therapeutic lifestyle changes which include good diet and exercise. Fall precautions discussed with patient. If an FOBT was given today- please return it to our front desk. If you are over 60 years old - you may need Prevnar 20 or the adult Pneumonia vaccine.  **Flu shots are available--- please call and schedule a FLU-CLINIC appointment**  After your visit with [place] today you will receive a survey in the mail or online from [employer] regarding your care with [place]. Please take a moment to fill this out. Your feedback is very important to [place] as you can help [place] better understand your patient needs as well as improve your experience and satisfaction. WE CARE ABOUT YOU!!!   Use ProctoCream-HC if needed for hemorrhoids 1 applicatorful daily Drink plenty of fluids and stay well-hydrated Be careful while you are taking the higher doses of ibuprofen as this can increase the risk of epigastric pain and heartburn. You may want to take ranitidine or Zantac while you stay on the ibuprofen.  Also the ibuprofen can raise your blood pressure to cause fluid retention. Follow-up with the urologist as needed. Make every effort to lose weight through diet and exercise to reduce BMI.

## 2018-05-13 NOTE — Addendum Note (Signed)
Addended by: Liliane Bade on: 05/13/2018 05:02 PM   Modules accepted: Orders

## 2018-05-13 NOTE — Progress Notes (Signed)
Subjective:    Patient ID: Jeffrey Barnett, male    DOB: 03-11-1961, 57 y.o.   MRN: [id number]  HPI Patient is here today for annual wellness exam and follow up of chronic medical problems which includes hypertension and hyperlipidemia. He is taking medication regularly.  Patient is doing well and his epididymitis being treated by the urologist is doing better but he is still taking NSAIDs for this.  Patient today denies any chest pain pressure tightness or shortness of breath.  He is like his wife and needs to get more exercise.  He denies any trouble with swallowing heartburn indigestion nausea vomiting diarrhea constipation.  He is having more trouble with hemorrhoids and has taken some MiraLAX and this seems to have helped this some but he is having more problems with this.  He is up-to-date on his colonoscopies and says he does not need another one until 10 years after the last one which would be sometime around 2024.  He is passing his water without problems.  The epididymitis has improved with the increased dose of NSAIDs.  He does occasionally have some rare heartburn and he does need to be more careful with taking the ibuprofen and make sure that he takes it after eating.  He also should be thinking about taking some ranitidine before eating while he is taking ibuprofen.    Patient Active Problem List   Diagnosis Date Noted  . Hypertension 04/03/2013  . Fever blister, history of   . Hyperlipidemia   . Villous adenoma, history of    Outpatient Encounter Medications as of 05/13/2018  Medication Sig  . Cholecalciferol (VITAMIN D) 2000 UNITS CAPS Take 1 capsule by mouth daily.  . enalapril (VASOTEC) 10 MG tablet TAKE 1 TABLET (10 MG TOTAL) BY MOUTH DAILY.  . fluticasone (FLONASE) 50 MCG/ACT nasal spray Place 2 sprays into both nostrils daily.  . Omega-3 Fatty Acids (FISH OIL) 1000 MG CAPS Take 2 capsules by mouth daily.    . pantoprazole (PROTONIX) 40 MG tablet TAKE 1 TABLET BY MOUTH EVERY  DAY  . rosuvastatin (CRESTOR) 20 MG tablet TAKE 1 TABLET BY MOUTH EVERY DAY  . sildenafil (REVATIO) 20 MG tablet TAKE 2-5 TABLETS BY MOUTH AS NEEDED PRIOR TO SEXUAL ACTIVITY.  Marland Kitchen valACYclovir (VALTREX) 1000 MG tablet TAKE 1 TABLET EVERY DAY AS DIRECTED  . [DISCONTINUED] ciprofloxacin (CIPRO) 500 MG tablet Take 1 tablet (500 mg total) by mouth 2 (two) times daily.   No facility-administered encounter medications on file as of 05/13/2018.      Review of Systems  Constitutional: Negative.   HENT: Negative.   Eyes: Negative.   Respiratory: Negative.   Cardiovascular: Negative.   Gastrointestinal: Negative.        Hemorrhoids  Endocrine: Negative.   Genitourinary: Negative.   Musculoskeletal: Negative.   Skin: Negative.   Allergic/Immunologic: Negative.   Neurological: Negative.   Hematological: Negative.   Psychiatric/Behavioral: Negative.        Objective:   Physical Exam  Constitutional: He is oriented to person, place, and time. He appears well-developed and well-nourished. No distress.  Patient is pleasant and alert and laid back  HENT:  Head: Normocephalic and atraumatic.  Right Ear: External ear normal.  Nose: Nose normal.  Mouth/Throat: Oropharynx is clear and moist. No oropharyngeal exudate.  Cerumen left ear canal  Eyes: Pupils are equal, round, and reactive to light. Conjunctivae and EOM are normal. Right eye exhibits no discharge. Left eye exhibits no discharge. No scleral  icterus.  Patient will make arrangements to see ophthalmologist  Neck: Normal range of motion. Neck supple. No thyromegaly present.  Bruits thyromegaly or anterior cervical adenopathy  Cardiovascular: Normal rate, regular rhythm, normal heart sounds and intact distal pulses.  No murmur heard. Heart is regular at 72/min  Pulmonary/Chest: Effort normal and breath sounds normal. He has no wheezes. He has no rales. He exhibits no tenderness.  Clear anteriorly and posteriorly and no axillary adenopathy  and no chest wall masses.  Abdominal: Soft. Bowel sounds are normal. He exhibits no mass. There is no tenderness. There is no guarding.  No abdominal tenderness masses epigastric tenderness liver or spleen enlargement bruits or inguinal adenopathy  Genitourinary: Rectum normal and penis normal.  Genitourinary Comments: The prostate is enlarged but soft and smooth.  There were no rectal masses.  There was no obvious hemorrhoids present and minimal pain and tenderness with doing a rectal exam.  There is no bleeding.  External genitalia were within normal limits.  And no hernias were palpated.  There was slight tenderness above the right testicle with his resolving epididymitis.  Musculoskeletal: Normal range of motion. He exhibits no edema.  Lymphadenopathy:    He has no cervical adenopathy.  Neurological: He is alert and oriented to person, place, and time. He has normal reflexes. No cranial nerve deficit.  Cranial nerves were intact with reflexes that were equal bilaterally and plantar reflexes that were down.  Skin: Skin is warm and dry. No rash noted.  Psychiatric: He has a normal mood and affect. His behavior is normal. Judgment and thought content normal.  Mood affect and behavior were all normal.  Nursing note and vitals reviewed.   BP 110/73 (BP Location: Left Arm)   Pulse 81   Temp 97.6 F (36.4 C) (Oral)   Ht 6' (1.829 m)   Wt 256 lb (116.1 kg)   BMI 34.72 kg/m        Assessment & Plan:  1. Annual physical exam -X-ray with results pending -Patient has an increased BMI he will make every effort to lose weight through diet and exercise and this was discussed with him during the visit. - BMP8+EGFR; Future - CBC with Differential/Platelet; Future - Lipid panel; Future - VITAMIN D 25 Hydroxy (Vit-D Deficiency, Fractures); Future - Hepatic function panel; Future - PSA, total and free; Future - Urinalysis, Complete; Future  2. Vitamin D deficiency -Continue vitamin D  replacement pending results of lab work - CBC with Differential/Platelet; Future - VITAMIN D 25 Hydroxy (Vit-D Deficiency, Fractures); Future  3. Essential hypertension -Blood pressure is good today and he will continue with current treatment - BMP8+EGFR; Future - CBC with Differential/Platelet; Future - Hepatic function panel; Future - DG Chest 2 View; Future  4. Pure hypercholesterolemia -Continue with statin therapy and therapeutic lifestyle changes - CBC with Differential/Platelet; Future - Lipid panel; Future - DG Chest 2 View; Future  5. Epididymitis -Continue with ibuprofen being careful to make sure there is food on the stomach before taking this and to protect the stomach while taking ibuprofen take ranitidine 150 twice daily before breakfast and supper.  6. Benign prostatic hyperplasia without lower urinary tract symptoms -No complaints with BPH.  7. Epigastric pain -No complaints with this today but is taking ibuprofen and he plans to take ranitidine twice daily before taking the ibuprofen.  8. Grade I hemorrhoids -No or minimal tenderness with rectal exam.  He will use ProctoCream-HC to see if he can help these hemorrhoids  get better continue with MiraLAX and drinking plenty of fluids  9. BMI 34.0-34.9,adult -All efforts to lose weight through diet and exercise.  Meds ordered this encounter  Medications  . hydrocortisone (ANUSOL-HC) 2.5 % rectal cream    Sig: Place 1 application rectally daily. X 1 week    Dispense:  30 g    Refill:  0   Patient Instructions  Continue current medications. Continue good therapeutic lifestyle changes which include good diet and exercise. Fall precautions discussed with patient. If an FOBT was given today- please return it to our front desk. If you are over 47 years old - you may need Prevnar 3 or the adult Pneumonia vaccine.  **Flu shots are available--- please call and schedule a FLU-CLINIC appointment**  After your visit  with [place] today you will receive a survey in the mail or online from [employer] regarding your care with [place]. Please take a moment to fill this out. Your feedback is very important to [place] as you can help [place] better understand your patient needs as well as improve your experience and satisfaction. WE CARE ABOUT YOU!!!   Use ProctoCream-HC if needed for hemorrhoids 1 applicatorful daily Drink plenty of fluids and stay well-hydrated Be careful while you are taking the higher doses of ibuprofen as this can increase the risk of epigastric pain and heartburn. You may want to take ranitidine or Zantac while you stay on the ibuprofen.  Also the ibuprofen can raise your blood pressure to cause fluid retention. Follow-up with the urologist as needed. Make every effort to lose weight through diet and exercise to reduce BMI.  Arrie Senate MD

## 2018-05-14 LAB — URINALYSIS, COMPLETE
Bilirubin, UA: NEGATIVE
Glucose, UA: NEGATIVE
Ketones, UA: NEGATIVE
Leukocytes, UA: NEGATIVE
Nitrite, UA: NEGATIVE
Protein, UA: NEGATIVE
RBC, UA: NEGATIVE
Specific Gravity, UA: >=1.03 — AB (ref 1.005–1.030)
Urobilinogen, Ur: 1 mg/dL (ref 0.2–1.0)
pH, UA: 7.5 (ref 5.0–7.5)

## 2018-05-14 LAB — MICROSCOPIC EXAMINATION
Bacteria, UA: NONE SEEN
Casts: NONE SEEN /lpf
Epithelial Cells (non renal): NONE SEEN /hpf (ref 0–10)
RBC, UA: NONE SEEN /hpf (ref 0–2)
WBC, UA: NONE SEEN /hpf (ref 0–5)

## 2018-05-21 ENCOUNTER — Encounter: Payer: Self-pay | Admitting: Family

## 2018-05-21 ENCOUNTER — Other Ambulatory Visit: Payer: BLUE CROSS/BLUE SHIELD

## 2018-05-21 ENCOUNTER — Ambulatory Visit (INDEPENDENT_AMBULATORY_CARE_PROVIDER_SITE_OTHER): Payer: BLUE CROSS/BLUE SHIELD | Admitting: Family

## 2018-05-21 VITALS — BP 127/84 | HR 74 | Temp 97.1°F | Ht 72.0 in | Wt 253.6 lb

## 2018-05-21 DIAGNOSIS — E7800 Pure hypercholesterolemia, unspecified: Secondary | ICD-10-CM | POA: Diagnosis not present

## 2018-05-21 DIAGNOSIS — E559 Vitamin D deficiency, unspecified: Secondary | ICD-10-CM | POA: Diagnosis not present

## 2018-05-21 DIAGNOSIS — K649 Unspecified hemorrhoids: Secondary | ICD-10-CM | POA: Diagnosis not present

## 2018-05-21 DIAGNOSIS — Z0000 Encounter for general adult medical examination without abnormal findings: Secondary | ICD-10-CM

## 2018-05-21 DIAGNOSIS — I10 Essential (primary) hypertension: Secondary | ICD-10-CM

## 2018-05-21 MED ORDER — HYDROCORTISONE 2.5 % RE CREA: 1.0000 "application " | TOPICAL_CREAM | Freq: Two times a day (BID) | RECTAL | 0 refills | Status: DC

## 2018-05-21 MED ORDER — HYDROCORTISONE ACETATE 25 MG RE SUPP: 25.0000 mg | Freq: Two times a day (BID) | RECTAL | 0 refills | Status: DC

## 2018-05-21 NOTE — Progress Notes (Signed)
   Subjective:    Patient ID: Jeffrey Barnett, male    DOB: 1961/06/15, 57 y.o.   MRN: [id number]  Chief Complaint  Patient presents with  . Hemorrhoids    x 1 week- external    HPI Pt presents to the office today with hemorrhoids that started over a week ago. He has tried hydrocortisone rectal cream with mild relief. Pt states he has intermittent burning, discomfort of 6 out 10. He has also tried soaking in the tub and using a pillow with mild relief. Denies any constipation.    Review of Systems  Gastrointestinal: Positive for blood in stool and rectal pain. Negative for constipation.  All other systems reviewed and are negative.      Objective:   Physical Exam  Constitutional: He is oriented to person, place, and time. He appears well-developed and well-nourished. No distress.  HENT:  Head: Normocephalic.  Right Ear: External ear normal.  Left Ear: External ear normal.  Mouth/Throat: Oropharynx is clear and moist.  Eyes: Pupils are equal, round, and reactive to light. Right eye exhibits no discharge. Left eye exhibits no discharge.  Neck: Normal range of motion. Neck supple. No thyromegaly present.  Cardiovascular: Normal rate, regular rhythm, normal heart sounds and intact distal pulses.  No murmur heard. Pulmonary/Chest: Effort normal and breath sounds normal. No respiratory distress. He has no wheezes.  Abdominal: Soft. Bowel sounds are normal. He exhibits no distension. There is no tenderness.  Genitourinary: Rectal exam shows external hemorrhoid.     Musculoskeletal: Normal range of motion. He exhibits no edema or tenderness.  Neurological: He is alert and oriented to person, place, and time. He has normal reflexes. No cranial nerve deficit.  Skin: Skin is warm and dry. No rash noted. No erythema.  Psychiatric: He has a normal mood and affect. His behavior is normal. Judgment and thought content normal.  Vitals reviewed.     BP 127/84   Pulse 74   Temp (!) 97.1  F (36.2 C) (Oral)   Ht 6' (1.829 m)   Wt 253 lb 9.6 oz (115 kg)   BMI 34.39 kg/m      Assessment & Plan:  Jeffrey Barnett comes in today with chief complaint of Hemorrhoids (x 1 week- external)   Diagnosis and orders addressed:  1. Hemorrhoids, unspecified hemorrhoid type Continue Miralax Avoid straining Referral pending for removal RTO if symptoms worsen or do not improve - Ambulatory referral to General Surgery - hydrocortisone (ANUSOL-HC) 2.5 % rectal cream; Place 1 application rectally 2 (two) times daily. X 1 week  Dispense: 30 g; Refill: 0 - hydrocortisone (ANUSOL-HC) 25 MG suppository; Place 1 suppository (25 mg total) rectally 2 (two) times daily.  Dispense: 12 suppository; Refill: 0   Evelina Dun, FNP

## 2018-05-21 NOTE — Patient Instructions (Signed)
Hemorrhoids Hemorrhoids are swollen veins in and around the rectum or anus. There are two types of hemorrhoids:  Internal hemorrhoids. These occur in the veins that are just inside the rectum. They may poke through to the outside and become irritated and painful.  External hemorrhoids. These occur in the veins that are outside of the anus and can be felt as a painful swelling or hard lump near the anus.  Most hemorrhoids do not cause serious problems, and they can be managed with home treatments such as diet and lifestyle changes. If home treatments do not help your symptoms, procedures can be done to shrink or remove the hemorrhoids. What are the causes? This condition is caused by increased pressure in the anal area. This pressure may result from various things, including:  Constipation.  Straining to have a bowel movement.  Diarrhea.  Pregnancy.  Obesity.  Sitting for long periods of time.  Heavy lifting or other activity that causes you to strain.  Anal sex.  What are the signs or symptoms? Symptoms of this condition include:  Pain.  Anal itching or irritation.  Rectal bleeding.  Leakage of stool (feces).  Anal swelling.  One or more lumps around the anus.  How is this diagnosed? This condition can often be diagnosed through a visual exam. Other exams or tests may also be done, such as:  Examination of the rectal area with a gloved hand (digital rectal exam).  Examination of the anal canal using a small tube (anoscope).  A blood test, if you have lost a significant amount of blood.  A test to look inside the colon (sigmoidoscopy or colonoscopy).  How is this treated? This condition can usually be treated at home. However, various procedures may be done if dietary changes, lifestyle changes, and other home treatments do not help your symptoms. These procedures can help make the hemorrhoids smaller or remove them completely. Some of these procedures involve  surgery, and others do not. Common procedures include:  Rubber band ligation. Rubber bands are placed at the base of the hemorrhoids to cut off the blood supply to them.  Sclerotherapy. Medicine is injected into the hemorrhoids to shrink them.  Infrared coagulation. A type of light energy is used to get rid of the hemorrhoids.  Hemorrhoidectomy surgery. The hemorrhoids are surgically removed, and the veins that supply them are tied off.  Stapled hemorrhoidopexy surgery. A circular stapling device is used to remove the hemorrhoids and use staples to cut off the blood supply to them.  Follow these instructions at home: Eating and drinking  Eat foods that have a lot of fiber in them, such as whole grains, beans, nuts, fruits, and vegetables. Ask your health care provider about taking products that have added fiber (fiber supplements).  Drink enough fluid to keep your urine clear or pale yellow. Managing pain and swelling  Take warm sitz baths for 20 minutes, 3-4 times a day to ease pain and discomfort.  If directed, apply ice to the affected area. Using ice packs between sitz baths may be helpful. ? Put ice in a plastic bag. ? Place a towel between your skin and the bag. ? Leave the ice on for 20 minutes, 2-3 times a day. General instructions  Take over-the-counter and prescription medicines only as told by your health care provider.  Use medicated creams or suppositories as told.  Exercise regularly.  Go to the bathroom when you have the urge to have a bowel movement. Do not wait.    Avoid straining to have bowel movements.  Keep the anal area dry and clean. Use wet toilet paper or moist towelettes after a bowel movement.  Do not sit on the toilet for long periods of time. This increases blood pooling and pain. Contact a health care provider if:  You have increasing pain and swelling that are not controlled by treatment or medicine.  You have uncontrolled bleeding.  You  have difficulty having a bowel movement, or you are unable to have a bowel movement.  You have pain or inflammation outside the area of the hemorrhoids. This information is not intended to replace advice given to you by your health care provider. Make sure you discuss any questions you have with your health care provider. Document Released: 08/21/2000 Document Revised: 01/22/2016 Document Reviewed: 05/08/2015 Elsevier Interactive Patient Education  2018 [employer]  

## 2018-05-23 LAB — CBC WITH DIFFERENTIAL/PLATELET
Basophils Absolute: 0 10*3/uL (ref 0.0–0.2)
Basos: 1 %
EOS (ABSOLUTE): 0.1 10*3/uL (ref 0.0–0.4)
Eos: 2 %
Hematocrit: 45.3 % (ref 37.5–51.0)
Hemoglobin: 15 g/dL (ref 13.0–17.7)
Immature Grans (Abs): 0 10*3/uL (ref 0.0–0.1)
Immature Granulocytes: 0 %
Lymphocytes Absolute: 1.6 10*3/uL (ref 0.7–3.1)
Lymphs: 25 %
MCH: 29.8 pg (ref 26.6–33.0)
MCHC: 33.1 g/dL (ref 31.5–35.7)
MCV: 90 fL (ref 79–97)
Monocytes Absolute: 0.5 10*3/uL (ref 0.1–0.9)
Monocytes: 8 %
Neutrophils Absolute: 4.2 10*3/uL (ref 1.4–7.0)
Neutrophils: 64 %
Platelets: 194 10*3/uL (ref 150–450)
RBC: 5.03 x10E6/uL (ref 4.14–5.80)
RDW: 13.2 % (ref 12.3–15.4)
WBC: 6.5 10*3/uL (ref 3.4–10.8)

## 2018-05-23 LAB — HEPATIC FUNCTION PANEL
ALT: 17 IU/L (ref 0–44)
AST: 21 IU/L (ref 0–40)
Albumin: 4.5 g/dL (ref 3.5–5.5)
Alkaline Phosphatase: 73 IU/L (ref 39–117)
Bilirubin Total: 0.5 mg/dL (ref 0.0–1.2)
Bilirubin, Direct: 0.09 mg/dL (ref 0.00–0.40)
Total Protein: 6.7 g/dL (ref 6.0–8.5)

## 2018-05-23 LAB — PSA, TOTAL AND FREE
PSA, Free Pct: 16 %
PSA, Free: 0.24 ng/mL
Prostate Specific Ag, Serum: 1.5 ng/mL (ref 0.0–4.0)

## 2018-05-23 LAB — LIPID PANEL
Chol/HDL Ratio: 3.3 ratio (ref 0.0–5.0)
Cholesterol, Total: 135 mg/dL (ref 100–199)
HDL: 41 mg/dL (ref >39)
LDL Calculated: 73 mg/dL (ref 0–99)
Triglycerides: 106 mg/dL (ref 0–149)
VLDL Cholesterol Cal: 21 mg/dL (ref 5–40)

## 2018-05-23 LAB — BMP8+EGFR
BUN/Creatinine Ratio: 17 (ref 9–20)
BUN: 18 mg/dL (ref 6–24)
CO2: 24 mmol/L (ref 20–29)
Calcium: 9.3 mg/dL (ref 8.7–10.2)
Chloride: 103 mmol/L (ref 96–106)
Creatinine, Ser: 1.04 mg/dL (ref 0.76–1.27)
GFR calc Af Amer: 90 mL/min/{1.73_m2} (ref >59)
GFR calc non Af Amer: 78 mL/min/{1.73_m2} (ref >59)
Glucose: 113 mg/dL — ABNORMAL HIGH (ref 65–99)
Potassium: 4.9 mmol/L (ref 3.5–5.2)
Sodium: 141 mmol/L (ref 134–144)

## 2018-05-23 LAB — VITAMIN D 25 HYDROXY (VIT D DEFICIENCY, FRACTURES): Vit D, 25-Hydroxy: 47 ng/mL (ref 30.0–100.0)

## 2018-06-01 DIAGNOSIS — K644 Residual hemorrhoidal skin tags: Secondary | ICD-10-CM | POA: Diagnosis not present

## 2018-06-18 ENCOUNTER — Other Ambulatory Visit: Payer: Self-pay | Admitting: Family Medicine

## 2018-07-08 ENCOUNTER — Other Ambulatory Visit: Payer: Self-pay | Admitting: Family Medicine

## 2018-07-29 ENCOUNTER — Ambulatory Visit (INDEPENDENT_AMBULATORY_CARE_PROVIDER_SITE_OTHER): Payer: BLUE CROSS/BLUE SHIELD

## 2018-07-29 DIAGNOSIS — Z23 Encounter for immunization: Secondary | ICD-10-CM

## 2018-10-13 ENCOUNTER — Other Ambulatory Visit: Payer: Self-pay | Admitting: Family Medicine

## 2018-11-01 DIAGNOSIS — K649 Unspecified hemorrhoids: Secondary | ICD-10-CM | POA: Diagnosis not present

## 2018-11-11 ENCOUNTER — Encounter: Payer: Self-pay | Admitting: Family Medicine

## 2018-11-11 ENCOUNTER — Ambulatory Visit (INDEPENDENT_AMBULATORY_CARE_PROVIDER_SITE_OTHER): Payer: BLUE CROSS/BLUE SHIELD | Admitting: Family Medicine

## 2018-11-11 VITALS — BP 112/69 | HR 67 | Temp 96.9°F | Ht 72.0 in | Wt 257.0 lb

## 2018-11-11 DIAGNOSIS — E559 Vitamin D deficiency, unspecified: Secondary | ICD-10-CM

## 2018-11-11 DIAGNOSIS — Z23 Encounter for immunization: Secondary | ICD-10-CM

## 2018-11-11 DIAGNOSIS — I10 Essential (primary) hypertension: Secondary | ICD-10-CM

## 2018-11-11 DIAGNOSIS — E7800 Pure hypercholesterolemia, unspecified: Secondary | ICD-10-CM

## 2018-11-11 MED ORDER — VALACYCLOVIR HCL 1 G PO TABS: ORAL_TABLET | ORAL | 1 refills | Status: DC

## 2018-11-11 NOTE — Patient Instructions (Addendum)
Continue current medications. Continue good therapeutic lifestyle changes which include good diet and exercise. Fall precautions discussed with patient. If an FOBT was given today- please return it to our front desk. If you are over 60 years old - you may need Prevnar 58 or the adult Pneumonia vaccine.  **Flu shots are available--- please call and schedule a FLU-CLINIC appointment**  After your visit with [place] today you will receive a survey in the mail or online from [employer] regarding your care with [place]. Please take a moment to fill this out. Your feedback is very important to [place] as you can help [place] better understand your patient needs as well as improve your experience and satisfaction. WE CARE ABOUT YOU!!!   Shingles shot that you received today may make your arm sore and you may feel like you are going to have the flu tomorrow so be prepared for this.  The same thing may happen with the second shingles shot. Use Debrox to remove ear wax from right ear canal Continue to drink plenty of fluids and stay well-hydrated Practice good hand hygiene and respiratory hygiene Make every effort with your wife to walk regularly and lose weight through diet and exercise The weight better be down at the next visit!!!!!

## 2018-11-11 NOTE — Progress Notes (Signed)
Subjective:    Patient ID: Jeffrey Barnett, male    DOB: 09/04/1961, 58 y.o.   MRN: [id number]  HPI Pt here for follow up and management of chronic medical problems which includes hypertension and hyperlipidemia. He is taking medication regularly.  Patient is doing well overall and has no specific complaints.  His wife just got over the shingles which was diagnosed about 3 weeks ago.  He is concerned about getting the shingles and is interested in getting the shingles shot.  We will discuss that with him during the visit today.  The patient's vital signs are stable and he has a BMI of 35.26.  She is pleasant and doing well as usual.  He does have an upcoming colonoscopy planned because of previous polyps by his gastroenterologist, Dr. Paulita Fujita.  He denies any chest pain pressure tightness or shortness of breath.  He is active physically and having no discomfort.  He has no trouble with swallowing heartburn indigestion nausea vomiting diarrhea or blood in the stool currently.  He recently had some problems with some hemorrhoids but this seems to have cleared up but his gastroenterologist is aware of this and will take special note when he does his exam to check this out.  He is passing his water well.  He is requesting refill on the Valtrex.     Patient Active Problem List   Diagnosis Date Noted  . Hypertension 04/03/2013  . Fever blister, history of   . Hyperlipidemia   . Villous adenoma, history of    Outpatient Encounter Medications as of 11/11/2018  Medication Sig  . Cholecalciferol (VITAMIN D) 2000 UNITS CAPS Take 1 capsule by mouth daily.  . enalapril (VASOTEC) 10 MG tablet TAKE 1 TABLET (10 MG TOTAL) BY MOUTH DAILY.  . fluticasone (FLONASE) 50 MCG/ACT nasal spray Place 2 sprays into both nostrils daily.  . hydrocortisone (ANUSOL-HC) 2.5 % rectal cream Place 1 application rectally 2 (two) times daily. X 1 week  . hydrocortisone (ANUSOL-HC) 25 MG suppository Place 1 suppository (25 mg total)  rectally 2 (two) times daily.  . Omega-3 Fatty Acids (FISH OIL) 1000 MG CAPS Take 2 capsules by mouth daily.    . pantoprazole (PROTONIX) 40 MG tablet TAKE 1 TABLET BY MOUTH EVERY DAY  . rosuvastatin (CRESTOR) 20 MG tablet TAKE 1 TABLET BY MOUTH EVERY DAY  . sildenafil (REVATIO) 20 MG tablet TAKE 2-5 TABLETS BY MOUTH AS NEEDED PRIOR TO SEXUAL ACTIVITY.  Marland Kitchen valACYclovir (VALTREX) 1000 MG tablet TAKE 1 TABLET EVERY DAY AS DIRECTED   No facility-administered encounter medications on file as of 11/11/2018.      Review of Systems  Constitutional: Negative.   HENT: Negative.   Eyes: Negative.   Respiratory: Negative.   Cardiovascular: Negative.   Gastrointestinal: Negative.   Endocrine: Negative.   Genitourinary: Negative.   Musculoskeletal: Negative.   Skin: Negative.   Allergic/Immunologic: Negative.   Neurological: Negative.   Hematological: Negative.   Psychiatric/Behavioral: Negative.        Objective:   Physical Exam Vitals signs and nursing note reviewed.  Constitutional:      General: He is not in acute distress.    Appearance: Normal appearance. He is well-developed. He is obese.  HENT:     Head: Normocephalic and atraumatic.     Right Ear: Tympanic membrane, ear canal and external ear normal. There is impacted cerumen.     Left Ear: Tympanic membrane, ear canal and external ear normal. There is no impacted  cerumen.     Nose: Nose normal. No congestion.     Mouth/Throat:     Mouth: Mucous membranes are dry.     Pharynx: Oropharynx is clear. No oropharyngeal exudate or posterior oropharyngeal erythema.  Eyes:     General: No scleral icterus.       Right eye: No discharge.        Left eye: No discharge.     Extraocular Movements: Extraocular movements intact.     Conjunctiva/sclera: Conjunctivae normal.     Pupils: Pupils are equal, round, and reactive to light.  Neck:     Musculoskeletal: Normal range of motion and neck supple.     Thyroid: No thyromegaly.      Vascular: No carotid bruit.     Trachea: No tracheal deviation.     Comments: No thyromegaly anterior cervical adenopathy or bruits Cardiovascular:     Rate and Rhythm: Normal rate and regular rhythm.     Pulses: Normal pulses.     Heart sounds: Normal heart sounds. No murmur.     Comments: Heart is regular at 72/min with no edema and good pedal pulses Pulmonary:     Effort: Pulmonary effort is normal.     Breath sounds: Normal breath sounds. No wheezing or rales.     Comments: Clear anteriorly and posteriorly and no axillary adenopathy chest wall masses or tenderness Chest:     Chest wall: No tenderness.  Abdominal:     General: Bowel sounds are normal.     Palpations: Abdomen is soft. There is no mass.     Tenderness: There is no abdominal tenderness.     Comments: Obese without masses tenderness organ enlargement or bruits  Musculoskeletal: Normal range of motion.        General: No tenderness.     Right lower leg: No edema.     Left lower leg: No edema.  Lymphadenopathy:     Cervical: No cervical adenopathy.  Skin:    General: Skin is warm and dry.     Findings: No rash.  Neurological:     Mental Status: He is alert and oriented to person, place, and time. Mental status is at baseline.     Cranial Nerves: No cranial nerve deficit.     Gait: Gait normal.     Deep Tendon Reflexes: Reflexes are normal and symmetric. Reflexes normal.  Psychiatric:        Mood and Affect: Mood normal.        Behavior: Behavior normal.        Thought Content: Thought content normal.        Judgment: Judgment normal.     Comments: Mood affect and behavior for this patient are all normal for him.  He is laid back and takes life easily.     BP 112/69 (BP Location: Left Arm)   Pulse 67   Temp (!) 96.9 F (36.1 C) (Oral)   Ht 6' (1.829 m)   Wt 257 lb (116.6 kg)   BMI 34.86 kg/m        Assessment & Plan:  1. Vitamin D deficiency -Continue vitamin D replacement pending results of lab  work - VITAMIN D 25 Hydroxy (Vit-D Deficiency, Fractures); Future - CBC with Differential/Platelet; Future  2. Essential hypertension -Blood pressure is good today and he will continue with current treatment - BMP8+EGFR; Future - Hepatic function panel; Future - CBC with Differential/Platelet; Future  3. Pure hypercholesterolemia -Continue Crestor and as aggressive therapeutic  lifestyle changes as possible including diet and exercise to achieve weight loss - Lipid panel; Future - CBC with Differential/Platelet; Future  4. Morbid obesity ([place]) -The patient once again promises to do better with diet and exercise and says his weight will be down when he comes back at the next visit.  I am going to hold him to that.  Meds ordered this encounter  Medications  . valACYclovir (VALTREX) 1000 MG tablet    Sig: TAKE 1 TABLET EVERY DAY AS DIRECTED    Dispense:  30 tablet    Refill:  1   Patient Instructions  Continue current medications. Continue good therapeutic lifestyle changes which include good diet and exercise. Fall precautions discussed with patient. If an FOBT was given today- please return it to our front desk. If you are over 56 years old - you may need Prevnar 87 or the adult Pneumonia vaccine.  **Flu shots are available--- please call and schedule a FLU-CLINIC appointment**  After your visit with [place] today you will receive a survey in the mail or online from [employer] regarding your care with [place]. Please take a moment to fill this out. Your feedback is very important to [place] as you can help [place] better understand your patient needs as well as improve your experience and satisfaction. WE CARE ABOUT YOU!!!   Shingles shot that you received today may make your arm sore and you may feel like you are going to have the flu tomorrow so be prepared for this.  The same thing may happen with the second shingles shot. Use Debrox to remove ear wax from right ear canal Continue to drink plenty  of fluids and stay well-hydrated Practice good hand hygiene and respiratory hygiene Make every effort with your wife to walk regularly and lose weight through diet and exercise The weight better be down at the next visit!!!!!   Arrie Senate MD

## 2018-11-11 NOTE — Addendum Note (Signed)
Addended by: Zannie Cove on: 11/11/2018 05:03 PM   Modules accepted: Orders

## 2018-11-23 DIAGNOSIS — D122 Benign neoplasm of ascending colon: Secondary | ICD-10-CM | POA: Diagnosis not present

## 2018-11-23 DIAGNOSIS — D123 Benign neoplasm of transverse colon: Secondary | ICD-10-CM | POA: Diagnosis not present

## 2018-11-23 DIAGNOSIS — Z86010 Personal history of colonic polyps: Secondary | ICD-10-CM | POA: Diagnosis not present

## 2018-11-23 DIAGNOSIS — D124 Benign neoplasm of descending colon: Secondary | ICD-10-CM | POA: Diagnosis not present

## 2018-11-23 DIAGNOSIS — K648 Other hemorrhoids: Secondary | ICD-10-CM | POA: Diagnosis not present

## 2018-11-25 DIAGNOSIS — D123 Benign neoplasm of transverse colon: Secondary | ICD-10-CM | POA: Diagnosis not present

## 2018-11-25 DIAGNOSIS — D124 Benign neoplasm of descending colon: Secondary | ICD-10-CM | POA: Diagnosis not present

## 2018-11-25 DIAGNOSIS — D122 Benign neoplasm of ascending colon: Secondary | ICD-10-CM | POA: Diagnosis not present

## 2018-12-09 ENCOUNTER — Other Ambulatory Visit: Payer: Self-pay | Admitting: Family Medicine

## 2019-01-15 ENCOUNTER — Other Ambulatory Visit: Payer: Self-pay | Admitting: Family Medicine

## 2019-02-14 ENCOUNTER — Other Ambulatory Visit: Payer: Self-pay

## 2019-02-14 ENCOUNTER — Encounter: Payer: Self-pay | Admitting: Family Medicine

## 2019-02-14 MED ORDER — DOXYCYCLINE HYCLATE 100 MG PO TABS: 100.0000 mg | ORAL_TABLET | Freq: Two times a day (BID) | ORAL | 0 refills | Status: DC

## 2019-04-21 ENCOUNTER — Other Ambulatory Visit: Payer: Self-pay | Admitting: Family Medicine

## 2019-05-12 ENCOUNTER — Encounter: Payer: Self-pay | Admitting: Family Medicine

## 2019-05-12 ENCOUNTER — Other Ambulatory Visit: Payer: Self-pay

## 2019-05-12 ENCOUNTER — Ambulatory Visit: Payer: BLUE CROSS/BLUE SHIELD | Admitting: Family Medicine

## 2019-05-12 ENCOUNTER — Ambulatory Visit (INDEPENDENT_AMBULATORY_CARE_PROVIDER_SITE_OTHER): Payer: BLUE CROSS/BLUE SHIELD | Admitting: Family Medicine

## 2019-05-12 VITALS — BP 115/69 | HR 75 | Temp 97.7°F | Ht 72.0 in | Wt 253.0 lb

## 2019-05-12 DIAGNOSIS — I10 Essential (primary) hypertension: Secondary | ICD-10-CM | POA: Diagnosis not present

## 2019-05-12 DIAGNOSIS — E559 Vitamin D deficiency, unspecified: Secondary | ICD-10-CM | POA: Diagnosis not present

## 2019-05-12 DIAGNOSIS — E7800 Pure hypercholesterolemia, unspecified: Secondary | ICD-10-CM | POA: Diagnosis not present

## 2019-05-12 DIAGNOSIS — Z125 Encounter for screening for malignant neoplasm of prostate: Secondary | ICD-10-CM

## 2019-05-12 MED ORDER — ENALAPRIL MALEATE 10 MG PO TABS: 10.0000 mg | ORAL_TABLET | Freq: Every day | ORAL | 1 refills | Status: DC

## 2019-05-12 MED ORDER — ROSUVASTATIN CALCIUM 20 MG PO TABS: 20.0000 mg | ORAL_TABLET | Freq: Every day | ORAL | 1 refills | Status: DC

## 2019-05-12 NOTE — Progress Notes (Signed)
Subjective:  Patient ID: Jeffrey Barnett, male    DOB: [birth year], 58 y.o.   MRN: [id number]  Patient Care Team: Baruch Gouty, FNP as PCP - General (Family Medicine)   Chief Complaint:  Medical Management of Chronic Issues (6 mo ), Hyperlipidemia, and Hypertension   HPI: Jeffrey Barnett is a 58 y.o. male presenting on 05/12/2019 for Medical Management of Chronic Issues (6 mo ), Hyperlipidemia, and Hypertension   1. Essential hypertension  Complaint with meds - Yes Current Medications - enalapril Checking BP at home - No Exercising Regularly - Yes Watching Salt intake - Yes Pertinent ROS:  Headache - No Fatigue - No Visual Disturbances - No Chest pain - No Dyspnea - No Palpitations - No LE edema - No They report good compliance with medications and can restate their regimen by memory. No medication side effects.  Family, social, and smoking history reviewed.   BP Readings from Last 3 Encounters:  05/12/19 115/69  11/11/18 112/69  05/21/18 127/84   CMP Latest Ref Rng & Units 05/21/2018 11/13/2017 05/15/2017  Glucose 65 - 99 mg/dL 113(H) 116(H) 116(H)  BUN 6 - 24 mg/dL _0 Creatinine 0.76 - 1.27 mg/dL 1.04 1.05 0.97  Sodium 134 - 144 mmol/L 141 141 141  Potassium 3.5 - 5.2 mmol/L 4.9 5.1 4.9  Chloride 96 - 106 mmol/L 103 103 104  CO2 20 - 29 mmol/L _1 Calcium 8.7 - 10.2 mg/dL 9.3 9.4 9.3  Total Protein 6.0 - 8.5 g/dL 6.7 7.0 6.9  Total Bilirubin 0.0 - 1.2 mg/dL 0.5 0.5 0.4  Alkaline Phos 39 - 117 IU/L 73 71 71  AST 0 - 40 IU/L _2 ALT 0 - 44 IU/L _3 2. Pure hypercholesterolemia  Compliant with medications - Yes Current medications - crestor Side effects from medications - No Diet - generally healthy Exercise - yes  Lab Results  Component Value Date   CHOL 135 05/21/2018   HDL 41 05/21/2018   LDLCALC 73 05/21/2018   TRIG 106 05/21/2018   CHOLHDL 3.3 05/21/2018     Family and personal medical history reviewed. Smoking and ETOH  history reviewed.    3. Vitamin D deficiency  Pt is taking oral repletion therapy. Denies bone pain and tenderness, muscle weakness, fracture, and difficulty walking. Lab Results  Component Value Date   VD25OH 47.0 05/21/2018   VD25OH 44.9 11/13/2017   VD25OH 49.5 05/15/2017   Lab Results  Component Value Date   CALCIUM 9.3 05/21/2018         Relevant past medical, surgical, family, and social history reviewed and updated as indicated.  Allergies and medications reviewed and updated. Date reviewed: Chart in Epic.   Past Medical History:  Diagnosis Date   Fever blister    Hypertension    Other and unspecified hyperlipidemia    Villous adenoma    Wax in ear     Past Surgical History:  Procedure Laterality Date   APPENDECTOMY     SHOULDER SURGERY Left y-3    Social History   Socioeconomic History   Marital status: Married    Spouse name: Not on file   Number of children: Not on file   Years of education: Not on file   Highest education level: Not on file  Occupational History   Not on file  Social Needs   Financial resource strain: Not on file  Food insecurity    Worry: Not on file    Inability: Not on file   Transportation needs    Medical: Not on file    Non-medical: Not on file  Tobacco Use   Smoking status: Never Smoker   Smokeless tobacco: Never Used  Substance and Sexual Activity   Alcohol use: No   Drug use: No   Sexual activity: Not on file  Lifestyle   Physical activity    Days per week: Not on file    Minutes per session: Not on file   Stress: Not on file  Relationships   Social connections    Talks on phone: Not on file    Gets together: Not on file    Attends religious service: Not on file    Active member of club or organization: Not on file    Attends meetings of clubs or organizations: Not on file    Relationship status: Not on file   Intimate partner violence    Fear of current or ex partner: Not on file     Emotionally abused: Not on file    Physically abused: Not on file    Forced sexual activity: Not on file  Other Topics Concern   Not on file  Social History Narrative   Not on file    Outpatient Encounter Medications as of 05/12/2019  Medication Sig   Cholecalciferol (VITAMIN D) 2000 UNITS CAPS Take 1 capsule by mouth daily.   enalapril (VASOTEC) 10 MG tablet Take 1 tablet (10 mg total) by mouth daily.   Omega-3 Fatty Acids (FISH OIL) 1000 MG CAPS Take 2 capsules by mouth daily.     pantoprazole (PROTONIX) 40 MG tablet TAKE 1 TABLET BY MOUTH EVERY DAY   rosuvastatin (CRESTOR) 20 MG tablet Take 1 tablet (20 mg total) by mouth daily.   [DISCONTINUED] enalapril (VASOTEC) 10 MG tablet TAKE 1 TABLET BY MOUTH EVERY DAY   [DISCONTINUED] rosuvastatin (CRESTOR) 20 MG tablet TAKE 1 TABLET BY MOUTH EVERY DAY   fluticasone (FLONASE) 50 MCG/ACT nasal spray Place 2 sprays into both nostrils daily. (Patient not taking: Reported on 05/12/2019)   hydrocortisone (ANUSOL-HC) 2.5 % rectal cream Place 1 application rectally 2 (two) times daily. X 1 week (Patient not taking: Reported on 05/12/2019)   hydrocortisone (ANUSOL-HC) 25 MG suppository Place 1 suppository (25 mg total) rectally 2 (two) times daily. (Patient not taking: Reported on 05/12/2019)   sildenafil (REVATIO) 20 MG tablet TAKE 2-5 TABLETS BY MOUTH AS NEEDED PRIOR TO SEXUAL ACTIVITY. (Patient not taking: Reported on 05/12/2019)   valACYclovir (VALTREX) 1000 MG tablet TAKE 1 TABLET BY MOUTH EVERY DAY AS DIRECTED (Patient not taking: Reported on 05/12/2019)   [DISCONTINUED] doxycycline (VIBRA-TABS) 100 MG tablet Take 1 tablet (100 mg total) by mouth 2 (two) times daily. 1 po bid   No facility-administered encounter medications on file as of 05/12/2019.     No Known Allergies  Review of Systems  Constitutional: Negative for activity change, appetite change, chills, diaphoresis, fatigue, fever and unexpected weight change.  HENT: Negative.     Eyes: Negative.  Negative for photophobia and visual disturbance.  Respiratory: Negative for cough, chest tightness and shortness of breath.   Cardiovascular: Negative for chest pain, palpitations and leg swelling.  Gastrointestinal: Negative for abdominal distention, abdominal pain, anal bleeding, blood in stool, constipation, diarrhea, nausea, rectal pain and vomiting.  Endocrine: Negative.  Negative for cold intolerance, heat intolerance, polydipsia, polyphagia and polyuria.  Genitourinary: Negative for decreased  urine volume, difficulty urinating, discharge, dysuria, enuresis, flank pain, frequency, genital sores, hematuria, penile pain, penile swelling, scrotal swelling, testicular pain and urgency.  Musculoskeletal: Negative for arthralgias and myalgias.  Skin: Negative.   Allergic/Immunologic: Negative.   Neurological: Negative for dizziness, tremors, seizures, syncope, facial asymmetry, speech difficulty, weakness, light-headedness, numbness and headaches.  Hematological: Negative.  Does not bruise/bleed easily.  Psychiatric/Behavioral: Negative for confusion, hallucinations, sleep disturbance and suicidal ideas.  All other systems reviewed and are negative.       Objective:  BP 115/69    Pulse 75    Temp 97.7 F (36.5 C)    Ht 6' (1.829 m)    Wt 253 lb (114.8 kg)    SpO2 97%    BMI 34.31 kg/m    Wt Readings from Last 3 Encounters:  05/12/19 253 lb (114.8 kg)  11/11/18 257 lb (116.6 kg)  05/21/18 253 lb 9.6 oz (115 kg)    Physical Exam Vitals signs and nursing note reviewed.  Constitutional:      General: He is not in acute distress.    Appearance: Normal appearance. He is well-developed and well-groomed. He is obese. He is not ill-appearing, toxic-appearing or diaphoretic.  HENT:     Head: Normocephalic and atraumatic.     Jaw: There is normal jaw occlusion.     Right Ear: Hearing normal.     Left Ear: Hearing normal.     Nose: Nose normal.     Mouth/Throat:      Lips: Pink.     Mouth: Mucous membranes are moist.     Pharynx: Oropharynx is clear. Uvula midline.  Eyes:     General: Lids are normal.     Extraocular Movements: Extraocular movements intact.     Conjunctiva/sclera: Conjunctivae normal.     Pupils: Pupils are equal, round, and reactive to light.  Neck:     Musculoskeletal: Normal range of motion and neck supple.     Thyroid: No thyroid mass, thyromegaly or thyroid tenderness.     Vascular: No carotid bruit or JVD.     Trachea: Trachea and phonation normal.  Cardiovascular:     Rate and Rhythm: Normal rate and regular rhythm.     Chest Wall: PMI is not displaced.     Pulses: Normal pulses.     Heart sounds: Normal heart sounds. No murmur. No friction rub. No gallop.   Pulmonary:     Effort: Pulmonary effort is normal. No respiratory distress.     Breath sounds: Normal breath sounds. No wheezing.  Abdominal:     General: Abdomen is protuberant. Bowel sounds are normal. There is no distension or abdominal bruit.     Palpations: Abdomen is soft. There is no hepatomegaly or splenomegaly.     Tenderness: There is no abdominal tenderness. There is no right CVA tenderness or left CVA tenderness.     Hernia: No hernia is present.  Musculoskeletal: Normal range of motion.     Right lower leg: No edema.     Left lower leg: No edema.  Lymphadenopathy:     Cervical: No cervical adenopathy.  Skin:    General: Skin is warm and dry.     Capillary Refill: Capillary refill takes less than 2 seconds.     Coloration: Skin is not cyanotic, jaundiced or pale.     Findings: No rash.  Neurological:     General: No focal deficit present.     Mental Status: He is alert and oriented  to person, place, and time.     Cranial Nerves: Cranial nerves are intact.     Sensory: Sensation is intact.     Motor: Motor function is intact.     Coordination: Coordination is intact.     Gait: Gait is intact.     Deep Tendon Reflexes: Reflexes are normal and  symmetric.  Psychiatric:        Attention and Perception: Attention and perception normal.        Mood and Affect: Mood and affect normal.        Speech: Speech normal.        Behavior: Behavior normal. Behavior is cooperative.        Thought Content: Thought content normal.        Cognition and Memory: Cognition and memory normal.        Judgment: Judgment normal.     Results for orders placed or performed in visit on 05/21/18  PSA, total and free  Result Value Ref Range   Prostate Specific Ag, Serum 1.5 0.0 - 4.0 ng/mL   PSA, Free 0.24 N/A ng/mL   PSA, Free Pct 16.0 %  Hepatic function panel  Result Value Ref Range   Total Protein 6.7 6.0 - 8.5 g/dL   Albumin 4.5 3.5 - 5.5 g/dL   Bilirubin Total 0.5 0.0 - 1.2 mg/dL   Bilirubin, Direct 0.09 0.00 - 0.40 mg/dL   Alkaline Phosphatase 73 39 - 117 IU/L   AST 21 0 - 40 IU/L   ALT 17 0 - 44 IU/L  VITAMIN D 25 Hydroxy (Vit-D Deficiency, Fractures)  Result Value Ref Range   Vit D, 25-Hydroxy 47.0 30.0 - 100.0 ng/mL  Lipid panel  Result Value Ref Range   Cholesterol, Total 135 100 - 199 mg/dL   Triglycerides 106 0 - 149 mg/dL   HDL 41 >39 mg/dL   VLDL Cholesterol Cal 21 5 - 40 mg/dL   LDL Calculated 73 0 - 99 mg/dL   Chol/HDL Ratio 3.3 0.0 - 5.0 ratio  CBC with Differential/Platelet  Result Value Ref Range   WBC 6.5 3.4 - 10.8 x10E3/uL   RBC 5.03 4.14 - 5.80 x10E6/uL   Hemoglobin 15.0 13.0 - 17.7 g/dL   Hematocrit 45.3 37.5 - 51.0 %   MCV 90 79 - 97 fL   MCH 29.8 26.6 - 33.0 pg   MCHC 33.1 31.5 - 35.7 g/dL   RDW 13.2 12.3 - 15.4 %   Platelets 194 150 - 450 x10E3/uL   Neutrophils 64 Not Estab. %   Lymphs 25 Not Estab. %   Monocytes 8 Not Estab. %   Eos 2 Not Estab. %   Basos 1 Not Estab. %   Neutrophils Absolute 4.2 1.4 - 7.0 x10E3/uL   Lymphocytes Absolute 1.6 0.7 - 3.1 x10E3/uL   Monocytes Absolute 0.5 0.1 - 0.9 x10E3/uL   EOS (ABSOLUTE) 0.1 0.0 - 0.4 x10E3/uL   Basophils Absolute 0.0 0.0 - 0.2 x10E3/uL   Immature  Granulocytes 0 Not Estab. %   Immature Grans (Abs) 0.0 0.0 - 0.1 x10E3/uL  BMP8+EGFR  Result Value Ref Range   Glucose 113 (H) 65 - 99 mg/dL   BUN 18 6 - 24 mg/dL   Creatinine, Ser 1.04 0.76 - 1.27 mg/dL   GFR calc non Af Amer 78 >59 mL/min/1.73   GFR calc Af Amer 90 >59 mL/min/1.73   BUN/Creatinine Ratio 17 9 - 20   Sodium 141 134 - 144 mmol/L  Potassium 4.9 3.5 - 5.2 mmol/L   Chloride 103 96 - 106 mmol/L   CO2 24 20 - 29 mmol/L   Calcium 9.3 8.7 - 10.2 mg/dL       Pertinent labs & imaging results that were available during my care of the patient were reviewed by me and considered in my medical decision making.  Assessment & Plan:  Prayan was seen today for medical management of chronic issues, hyperlipidemia and hypertension.  Diagnoses and all orders for this visit:  Essential hypertension BP well controlled. Changes were not made in regimen today. Daily blood pressure log given with instructions on how to fill out and told to bring to next visit. Gaol BP 130/80. Pt aware to report any persistent high or low readings. DASH diet and exercise encouraged. Exercise at least 150 minutes per week and increase as tolerated. Goal BMI > 25. Stress management encouraged. Smoking cessation discussed. Avoid excessive alcohol. Avoid NSAID's. Avoid more than 2000 mg of sodium daily. Medications as prescribed. Follow up as scheduled.  -     CMP14+EGFR; Future -     CBC with Differential/Platelet; Future -     Thyroid Panel With TSH; Future -     Microalbumin / creatinine urine ratio; Future -     enalapril (VASOTEC) 10 MG tablet; Take 1 tablet (10 mg total) by mouth daily.  Pure hypercholesterolemia Diet encouraged - increase intake of fresh fruits and vegetables, increase intake of lean proteins. Bake, broil, or grill foods. Avoid fried, greasy, and fatty foods. Avoid fast foods. Increase intake of fiber-rich whole grains. Exercise encouraged - at least 150 minutes per week and advance as  tolerated.  Goal BMI < 25. Continue medications as prescribed. Follow up in 3-6 months as discussed.  -     Lipid panel; Future -     rosuvastatin (CRESTOR) 20 MG tablet; Take 1 tablet (20 mg total) by mouth daily.  Vitamin D deficiency Labs pending. Continue repletion therapy. If indicated, will change repletion dosage. Eat foods rich in Vit D including milk, orange juice, yogurt with vitamin D added, salmon or mackerel, canned tuna fish, cereals with vitamin D added, and cod liver oil. Get out in the sun but make sure to wear at least SPF 30 sunscreen.  -     VITAMIN D 25 Hydroxy (Vit-D Deficiency, Fractures); Future  Screening for prostate cancer No urinary difficulties, scrotal pain, rectal pain, or penile pain. No hematuria. Last PSA 05/2018 normal. Will recheck today.  -     PSA, total and free; Future     Continue all other maintenance medications.  Follow up plan: Return in about 6 months (around 11/09/2019), or if symptoms worsen or fail to improve.  Continue healthy lifestyle choices, including diet (rich in fruits, vegetables, and lean proteins, and low in salt and simple carbohydrates) and exercise (at least 30 minutes of moderate physical activity daily).  Educational handout given for survey, COVID-19  The above assessment and management plan was discussed with the patient. The patient verbalized understanding of and has agreed to the management plan. Patient is aware to call the clinic if symptoms persist or worsen. Patient is aware when to return to the clinic for a follow-up visit. Patient educated on when it is appropriate to go to the emergency department.   Monia Pouch, FNP-C [facility] [phone]

## 2019-05-12 NOTE — Patient Instructions (Addendum)
It was a pleasure seeing you today, Karen.  Information regarding what we discussed is included in this packet.  Please make an appointment to see me in 6 months.   In a few days you may receive a survey in the mail or online from [employer] regarding your visit with [place] today. Please take a moment to fill this out. Your feedback is very important to our office. It can help [place] better understand your needs as well as improve your experience and satisfaction. Thank you for taking your time to complete it. We care about you.  Because of recent events of COVID-19 ("Coronavirus"), please follow CDC recommendations:   1. Wash your hand frequently 2. Avoid touching your face 3. Stay away from people who are sick 4. If you have symptoms such as fever, cough, shortness of breath then call your healthcare provider for further guidance 5. If you are sick, STAY AT HOME, unless otherwise directed by your healthcare provider. 6. Follow directions from state and national officials regarding staying safe    Please feel free to call our office if any questions or concerns arise.  Warm Regards, Monia Pouch, FNP-C [facility] [address] [place], [place] [address] [phone]

## 2019-05-17 ENCOUNTER — Other Ambulatory Visit: Payer: BLUE CROSS/BLUE SHIELD

## 2019-05-17 ENCOUNTER — Other Ambulatory Visit: Payer: Self-pay

## 2019-05-17 DIAGNOSIS — I10 Essential (primary) hypertension: Secondary | ICD-10-CM

## 2019-05-17 DIAGNOSIS — Z125 Encounter for screening for malignant neoplasm of prostate: Secondary | ICD-10-CM

## 2019-05-17 DIAGNOSIS — E7800 Pure hypercholesterolemia, unspecified: Secondary | ICD-10-CM

## 2019-05-17 DIAGNOSIS — E559 Vitamin D deficiency, unspecified: Secondary | ICD-10-CM

## 2019-05-18 LAB — CMP14+EGFR
ALT: 19 IU/L (ref 0–44)
AST: 23 IU/L (ref 0–40)
Albumin/Globulin Ratio: 1.9 (ref 1.2–2.2)
Albumin: 4.4 g/dL (ref 3.8–4.9)
Alkaline Phosphatase: 80 IU/L (ref 39–117)
BUN/Creatinine Ratio: 20 (ref 10–24)
BUN: 19 mg/dL (ref 8–27)
Bilirubin Total: 0.6 mg/dL (ref 0.0–1.2)
CO2: 21 mmol/L (ref 20–29)
Calcium: 9.1 mg/dL (ref 8.6–10.2)
Chloride: 103 mmol/L (ref 96–106)
Creatinine, Ser: 0.96 mg/dL (ref 0.76–1.27)
GFR calc Af Amer: 99 mL/min/{1.73_m2} (ref >59)
GFR calc non Af Amer: 86 mL/min/{1.73_m2} (ref >59)
Globulin, Total: 2.3 g/dL (ref 1.5–4.5)
Glucose: 127 mg/dL — ABNORMAL HIGH (ref 65–99)
Potassium: 4.6 mmol/L (ref 3.5–5.2)
Sodium: 137 mmol/L (ref 134–144)
Total Protein: 6.7 g/dL (ref 6.0–8.5)

## 2019-05-18 LAB — CBC WITH DIFFERENTIAL/PLATELET
Basophils Absolute: 0 10*3/uL (ref 0.0–0.2)
Basos: 1 %
EOS (ABSOLUTE): 0.1 10*3/uL (ref 0.0–0.4)
Eos: 1 %
Hematocrit: 44.9 % (ref 37.5–51.0)
Hemoglobin: 15.1 g/dL (ref 13.0–17.7)
Immature Grans (Abs): 0 10*3/uL (ref 0.0–0.1)
Immature Granulocytes: 0 %
Lymphocytes Absolute: 1.4 10*3/uL (ref 0.7–3.1)
Lymphs: 25 %
MCH: 28.5 pg (ref 26.6–33.0)
MCHC: 33.6 g/dL (ref 31.5–35.7)
MCV: 85 fL (ref 79–97)
Monocytes Absolute: 0.5 10*3/uL (ref 0.1–0.9)
Monocytes: 8 %
Neutrophils Absolute: 3.7 10*3/uL (ref 1.4–7.0)
Neutrophils: 65 %
Platelets: 222 10*3/uL (ref 150–450)
RBC: 5.29 x10E6/uL (ref 4.14–5.80)
RDW: 13.1 % (ref 11.6–15.4)
WBC: 5.7 10*3/uL (ref 3.4–10.8)

## 2019-05-18 LAB — LIPID PANEL
Chol/HDL Ratio: 2.8 ratio (ref 0.0–5.0)
Cholesterol, Total: 121 mg/dL (ref 100–199)
HDL: 44 mg/dL (ref >39)
LDL Chol Calc (NIH): 60 mg/dL (ref 0–99)
Triglycerides: 87 mg/dL (ref 0–149)
VLDL Cholesterol Cal: 17 mg/dL (ref 5–40)

## 2019-05-18 LAB — VITAMIN D 25 HYDROXY (VIT D DEFICIENCY, FRACTURES): Vit D, 25-Hydroxy: 46.2 ng/mL (ref 30.0–100.0)

## 2019-05-18 LAB — THYROID PANEL WITH TSH
Free Thyroxine Index: 2 (ref 1.2–4.9)
T3 Uptake Ratio: 26 % (ref 24–39)
T4, Total: 7.7 ug/dL (ref 4.5–12.0)
TSH: 1.54 u[IU]/mL (ref 0.450–4.500)

## 2019-05-18 LAB — PSA, TOTAL AND FREE
PSA, Free Pct: 11.8 %
PSA, Free: 0.39 ng/mL
Prostate Specific Ag, Serum: 3.3 ng/mL (ref 0.0–4.0)

## 2019-05-22 ENCOUNTER — Other Ambulatory Visit: Payer: Self-pay | Admitting: Family Medicine

## 2019-05-22 DIAGNOSIS — R972 Elevated prostate specific antigen [PSA]: Secondary | ICD-10-CM

## 2019-08-21 ENCOUNTER — Encounter: Payer: Self-pay | Admitting: Family Medicine

## 2019-09-07 DIAGNOSIS — R972 Elevated prostate specific antigen [PSA]: Secondary | ICD-10-CM | POA: Diagnosis not present

## 2019-09-12 ENCOUNTER — Other Ambulatory Visit: Payer: Self-pay | Admitting: Family Medicine

## 2019-09-18 ENCOUNTER — Encounter: Payer: Self-pay | Admitting: Family Medicine

## 2019-09-22 ENCOUNTER — Other Ambulatory Visit: Payer: Self-pay

## 2019-09-25 ENCOUNTER — Ambulatory Visit (INDEPENDENT_AMBULATORY_CARE_PROVIDER_SITE_OTHER): Payer: BLUE CROSS/BLUE SHIELD | Admitting: Family Medicine

## 2019-09-25 ENCOUNTER — Encounter: Payer: Self-pay | Admitting: Family Medicine

## 2019-09-25 ENCOUNTER — Other Ambulatory Visit: Payer: Self-pay

## 2019-09-25 VITALS — BP 126/72 | HR 81 | Temp 97.8°F | Resp 20 | Ht 72.0 in | Wt 257.0 lb

## 2019-09-25 DIAGNOSIS — S91131A Puncture wound without foreign body of right great toe without damage to nail, initial encounter: Secondary | ICD-10-CM

## 2019-09-25 DIAGNOSIS — T148XXA Other injury of unspecified body region, initial encounter: Secondary | ICD-10-CM

## 2019-09-25 DIAGNOSIS — Z23 Encounter for immunization: Secondary | ICD-10-CM | POA: Diagnosis not present

## 2019-09-25 NOTE — Progress Notes (Signed)
Subjective:  Patient ID: Jeffrey Barnett, male    DOB: 09/12/1960, 59 y.o.   MRN: [id number]  Patient Care Team: Baruch Gouty, FNP as PCP - General (Family Medicine)   Chief Complaint:  stepped on a nail (saturday - needs updated TD )   HPI: Jeffrey Barnett is a 59 y.o. male presenting on 09/25/2019 for stepped on a nail (saturday - needs updated TD )   Pt presents today after stepping on an old nail Saturday. Pt states he was working on an old barn and stepped down on a nail. He was wearing work boots when this happened. States he has been cleaning the wound daily. No pain or erythema. No swelling, fever, or chills.  Pt is not up to date on his Td, will update today.  Pt is due for second Shingrix, will update today.     Relevant past medical, surgical, family, and social history reviewed and updated as indicated.  Allergies and medications reviewed and updated. Date reviewed: Chart in Epic.   Past Medical History:  Diagnosis Date  . Fever blister   . Hypertension   . Other and unspecified hyperlipidemia   . Villous adenoma   . Wax in ear     Past Surgical History:  Procedure Laterality Date  . APPENDECTOMY    . SHOULDER SURGERY Left y-3    Social History   Socioeconomic History  . Marital status: Married    Spouse name: Not on file  . Number of children: Not on file  . Years of education: Not on file  . Highest education level: Not on file  Occupational History  . Not on file  Tobacco Use  . Smoking status: Never Smoker  . Smokeless tobacco: Never Used  Substance and Sexual Activity  . Alcohol use: No  . Drug use: No  . Sexual activity: Not on file  Other Topics Concern  . Not on file  Social History Narrative  . Not on file   Social Determinants of Health   Financial Resource Strain:   . Difficulty of Paying Living Expenses: Not on file  Food Insecurity:   . Worried About Charity fundraiser in the Last Year: Not on file  . Ran Out of Food in  the Last Year: Not on file  Transportation Needs:   . Lack of Transportation (Medical): Not on file  . Lack of Transportation (Non-Medical): Not on file  Physical Activity:   . Days of Exercise per Week: Not on file  . Minutes of Exercise per Session: Not on file  Stress:   . Feeling of Stress : Not on file  Social Connections:   . Frequency of Communication with Friends and Family: Not on file  . Frequency of Social Gatherings with Friends and Family: Not on file  . Attends Religious Services: Not on file  . Active Member of Clubs or Organizations: Not on file  . Attends Archivist Meetings: Not on file  . Marital Status: Not on file  Intimate Partner Violence:   . Fear of Current or Ex-Partner: Not on file  . Emotionally Abused: Not on file  . Physically Abused: Not on file  . Sexually Abused: Not on file    Outpatient Encounter Medications as of 09/25/2019  Medication Sig  . Cholecalciferol (VITAMIN D) 2000 UNITS CAPS Take 1 capsule by mouth daily.  . enalapril (VASOTEC) 10 MG tablet Take 1 tablet (10 mg total) by mouth  daily.  . fluticasone (FLONASE) 50 MCG/ACT nasal spray Place 2 sprays into both nostrils daily.  . Omega-3 Fatty Acids (FISH OIL) 1000 MG CAPS Take 2 capsules by mouth daily.    . sildenafil (REVATIO) 20 MG tablet TAKE 2-5 TABLETS BY MOUTH AS NEEDED PRIOR TO SEXUAL ACTIVITY.  Marland Kitchen valACYclovir (VALTREX) 1000 MG tablet TAKE 1 TABLET BY MOUTH EVERY DAY AS DIRECTED  . rosuvastatin (CRESTOR) 20 MG tablet Take 1 tablet (20 mg total) by mouth daily.  . [DISCONTINUED] hydrocortisone (ANUSOL-HC) 2.5 % rectal cream Place 1 application rectally 2 (two) times daily. X 1 week (Patient not taking: Reported on 05/12/2019)  . [DISCONTINUED] hydrocortisone (ANUSOL-HC) 25 MG suppository Place 1 suppository (25 mg total) rectally 2 (two) times daily. (Patient not taking: Reported on 05/12/2019)  . [DISCONTINUED] pantoprazole (PROTONIX) 40 MG tablet TAKE 1 TABLET BY MOUTH EVERY  DAY   No facility-administered encounter medications on file as of 09/25/2019.    No Known Allergies  Review of Systems  Constitutional: Negative for activity change, appetite change, chills, fatigue and fever.  HENT: Negative.   Eyes: Negative.   Respiratory: Negative for cough, chest tightness and shortness of breath.   Cardiovascular: Negative for chest pain, palpitations and leg swelling.  Gastrointestinal: Negative for blood in stool, constipation, diarrhea, nausea and vomiting.  Endocrine: Negative.   Genitourinary: Negative for dysuria, frequency and urgency.  Musculoskeletal: Negative for arthralgias and myalgias.  Skin: Positive for wound. Negative for color change.  Allergic/Immunologic: Negative.   Neurological: Negative for dizziness and headaches.  Hematological: Negative.   Psychiatric/Behavioral: Negative for confusion, hallucinations, sleep disturbance and suicidal ideas.  All other systems reviewed and are negative.       Objective:  BP 126/72   Pulse 81   Temp 97.8 F (36.6 C)   Resp 20   Ht 6' (1.829 m)   Wt 257 lb (116.6 kg)   SpO2 98%   BMI 34.86 kg/m    Wt Readings from Last 3 Encounters:  09/25/19 257 lb (116.6 kg)  05/12/19 253 lb (114.8 kg)  11/11/18 257 lb (116.6 kg)    Physical Exam Vitals and nursing note reviewed.  Constitutional:      General: He is not in acute distress.    Appearance: Normal appearance. He is well-developed and well-groomed. He is not ill-appearing, toxic-appearing or diaphoretic.  HENT:     Head: Normocephalic and atraumatic.     Jaw: There is normal jaw occlusion.     Right Ear: Hearing normal.     Left Ear: Hearing normal.     Nose: Nose normal.     Mouth/Throat:     Lips: Pink.     Mouth: Mucous membranes are moist.     Pharynx: Oropharynx is clear. Uvula midline.  Eyes:     General: Lids are normal.     Extraocular Movements: Extraocular movements intact.     Conjunctiva/sclera: Conjunctivae normal.      Pupils: Pupils are equal, round, and reactive to light.  Neck:     Thyroid: No thyroid mass, thyromegaly or thyroid tenderness.     Vascular: No carotid bruit or JVD.     Trachea: Trachea and phonation normal.  Cardiovascular:     Rate and Rhythm: Normal rate and regular rhythm.     Chest Wall: PMI is not displaced.     Pulses: Normal pulses.     Heart sounds: Normal heart sounds. No murmur. No friction rub. No gallop.   Pulmonary:  Effort: Pulmonary effort is normal. No respiratory distress.     Breath sounds: Normal breath sounds. No wheezing.  Abdominal:     General: Bowel sounds are normal. There is no distension or abdominal bruit.     Palpations: Abdomen is soft. There is no hepatomegaly or splenomegaly.     Tenderness: There is no abdominal tenderness. There is no right CVA tenderness or left CVA tenderness.     Hernia: No hernia is present.  Musculoskeletal:        General: Normal range of motion.     Cervical back: Normal range of motion and neck supple.     Right lower leg: No edema.     Left lower leg: No edema.  Lymphadenopathy:     Cervical: No cervical adenopathy.  Skin:    General: Skin is warm and dry.     Capillary Refill: Capillary refill takes less than 2 seconds.     Coloration: Skin is not cyanotic, jaundiced or pale.     Findings: Wound present. No rash.     Comments: Puncture wound to bottom of right great toe. No erythema, swelling, or drainage.   Neurological:     General: No focal deficit present.     Mental Status: He is alert and oriented to person, place, and time.     Cranial Nerves: Cranial nerves are intact.     Sensory: Sensation is intact.     Motor: Motor function is intact.     Coordination: Coordination is intact.     Gait: Gait is intact.     Deep Tendon Reflexes: Reflexes are normal and symmetric.  Psychiatric:        Attention and Perception: Attention and perception normal.        Mood and Affect: Mood and affect normal.         Speech: Speech normal.        Behavior: Behavior normal. Behavior is cooperative.        Thought Content: Thought content normal.        Cognition and Memory: Cognition and memory normal.        Judgment: Judgment normal.     Results for orders placed or performed in visit on 05/17/19  PSA, total and free  Result Value Ref Range   Prostate Specific Ag, Serum 3.3 0.0 - 4.0 ng/mL   PSA, Free 0.39 N/A ng/mL   PSA, Free Pct 11.8 %  VITAMIN D 25 Hydroxy (Vit-D Deficiency, Fractures)  Result Value Ref Range   Vit D, 25-Hydroxy 46.2 30.0 - 100.0 ng/mL  Thyroid Panel With TSH  Result Value Ref Range   TSH 1.540 0.450 - 4.500 uIU/mL   T4, Total 7.7 4.5 - 12.0 ug/dL   T3 Uptake Ratio 26 24 - 39 %   Free Thyroxine Index 2.0 1.2 - 4.9  Lipid panel  Result Value Ref Range   Cholesterol, Total 121 100 - 199 mg/dL   Triglycerides 87 0 - 149 mg/dL   HDL 44 >39 mg/dL   VLDL Cholesterol Cal 17 5 - 40 mg/dL   LDL Chol Calc (NIH) 60 0 - 99 mg/dL   Chol/HDL Ratio 2.8 0.0 - 5.0 ratio  CBC with Differential/Platelet  Result Value Ref Range   WBC 5.7 3.4 - 10.8 x10E3/uL   RBC 5.29 4.14 - 5.80 x10E6/uL   Hemoglobin 15.1 13.0 - 17.7 g/dL   Hematocrit 44.9 37.5 - 51.0 %   MCV 85 79 - 97 fL  MCH 28.5 26.6 - 33.0 pg   MCHC 33.6 31.5 - 35.7 g/dL   RDW 13.1 11.6 - 15.4 %   Platelets 222 150 - 450 x10E3/uL   Neutrophils 65 Not Estab. %   Lymphs 25 Not Estab. %   Monocytes 8 Not Estab. %   Eos 1 Not Estab. %   Basos 1 Not Estab. %   Neutrophils Absolute 3.7 1.4 - 7.0 x10E3/uL   Lymphocytes Absolute 1.4 0.7 - 3.1 x10E3/uL   Monocytes Absolute 0.5 0.1 - 0.9 x10E3/uL   EOS (ABSOLUTE) 0.1 0.0 - 0.4 x10E3/uL   Basophils Absolute 0.0 0.0 - 0.2 x10E3/uL   Immature Granulocytes 0 Not Estab. %   Immature Grans (Abs) 0.0 0.0 - 0.1 x10E3/uL  CMP14+EGFR  Result Value Ref Range   Glucose 127 (H) 65 - 99 mg/dL   BUN 19 8 - 27 mg/dL   Creatinine, Ser 0.96 0.76 - 1.27 mg/dL   GFR calc non Af Amer 86 >59  mL/min/1.73   GFR calc Af Amer 99 >59 mL/min/1.73   BUN/Creatinine Ratio 20 10 - 24   Sodium 137 134 - 144 mmol/L   Potassium 4.6 3.5 - 5.2 mmol/L   Chloride 103 96 - 106 mmol/L   CO2 21 20 - 29 mmol/L   Calcium 9.1 8.6 - 10.2 mg/dL   Total Protein 6.7 6.0 - 8.5 g/dL   Albumin 4.4 3.8 - 4.9 g/dL   Globulin, Total 2.3 1.5 - 4.5 g/dL   Albumin/Globulin Ratio 1.9 1.2 - 2.2   Bilirubin Total 0.6 0.0 - 1.2 mg/dL   Alkaline Phosphatase 80 39 - 117 IU/L   AST 23 0 - 40 IU/L   ALT 19 0 - 44 IU/L       Pertinent labs & imaging results that were available during my care of the patient were reviewed by me and considered in my medical decision making.  Assessment & Plan:  Nehan was seen today for stepped on a nail.  Diagnoses and all orders for this visit:  Puncture wound Wound care discussed in detail. Updated Td.  -     Td : Tetanus/diphtheria >7yo Preservative  free  Encounter for immunization Updated Td and Shingrix today.  -     Td : Tetanus/diphtheria >7yo Preservative  free -     Varicella-zoster vaccine IM (Shingrix)     Continue all other maintenance medications.  Follow up plan: Return if symptoms worsen or fail to improve.  Continue healthy lifestyle choices, including diet (rich in fruits, vegetables, and lean proteins, and low in salt and simple carbohydrates) and exercise (at least 30 minutes of moderate physical activity daily).  Educational handout given for wound care  The above assessment and management plan was discussed with the patient. The patient verbalized understanding of and has agreed to the management plan. Patient is aware to call the clinic if they develop any new symptoms or if symptoms persist or worsen. Patient is aware when to return to the clinic for a follow-up visit. Patient educated on when it is appropriate to go to the emergency department.   Monia Pouch, FNP-C [facility] [phone]

## 2019-09-25 NOTE — Patient Instructions (Signed)
Wound Care, Adult Taking care of your wound properly can help to prevent pain, infection, and scarring. It can also help your wound to heal more quickly. How to care for your wound Wound care      Follow instructions from your health care provider about how to take care of your wound. Make sure you: ? Wash your hands with soap and water before you change the bandage (dressing). If soap and water are not available, use hand sanitizer. ? Change your dressing as told by your health care provider. ? Leave stitches (sutures), skin glue, or adhesive strips in place. These skin closures may need to stay in place for 2 weeks or longer. If adhesive strip edges start to loosen and curl up, you may trim the loose edges. Do not remove adhesive strips completely unless your health care provider tells you to do that.  Check your wound area every day for signs of infection. Check for: ? Redness, swelling, or pain. ? Fluid or blood. ? Warmth. ? Pus or a bad smell.  Ask your health care provider if you should clean the wound with mild soap and water. Doing this may include: ? Using a clean towel to pat the wound dry after cleaning it. Do not rub or scrub the wound. ? Applying a cream or ointment. Do this only as told by your health care provider. ? Covering the incision with a clean dressing.  Ask your health care provider when you can leave the wound uncovered.  Keep the dressing dry until your health care provider says it can be removed. Do not take baths, swim, use a hot tub, or do anything that would put the wound underwater until your health care provider approves. Ask your health care provider if you can take showers. You may only be allowed to take sponge baths. Medicines   If you were prescribed an antibiotic medicine, cream, or ointment, take or use the antibiotic as told by your health care provider. Do not stop taking or using the antibiotic even if your condition improves.  Take  over-the-counter and prescription medicines only as told by your health care provider. If you were prescribed pain medicine, take it 30 or more minutes before you do any wound care or as told by your health care provider. General instructions  Return to your normal activities as told by your health care provider. Ask your health care provider what activities are safe.  Do not scratch or pick at the wound.  Do not use any products that contain nicotine or tobacco, such as cigarettes and e-cigarettes. These may delay wound healing. If you need help quitting, ask your health care provider.  Keep all follow-up visits as told by your health care provider. This is important.  Eat a diet that includes protein, vitamin A, vitamin C, and other nutrient-rich foods to help the wound heal. ? Foods rich in protein include meat, dairy, beans, nuts, and other sources. ? Foods rich in vitamin A include carrots and dark green, leafy vegetables. ? Foods rich in vitamin C include citrus, tomatoes, and other fruits and vegetables. ? Nutrient-rich foods have protein, carbohydrates, fat, vitamins, or minerals. Eat a variety of healthy foods including vegetables, fruits, and whole grains. Contact a health care provider if:  You received a tetanus shot and you have swelling, severe pain, redness, or bleeding at the injection site.  Your pain is not controlled with medicine.  You have redness, swelling, or pain around the wound.    You have fluid or blood coming from the wound.  Your wound feels warm to the touch.  You have pus or a bad smell coming from the wound.  You have a fever or chills.  You are nauseous or you vomit.  You are dizzy. Get help right away if:  You have a red streak going away from your wound.  The edges of the wound open up and separate.  Your wound is bleeding, and the bleeding does not stop with gentle pressure.  You have a rash.  You faint.  You have trouble  breathing. Summary  Always wash your hands with soap and water before changing your bandage (dressing).  To help with healing, eat foods that are rich in protein, vitamin A, vitamin C, and other nutrients.  Check your wound every day for signs of infection. Contact your health care provider if you suspect that your wound is infected. This information is not intended to replace advice given to you by your health care provider. Make sure you discuss any questions you have with your health care provider. Document Revised: 12/12/2018 Document Reviewed: 03/10/2016 [employer] Patient Education  2020 [employer]  

## 2019-10-02 ENCOUNTER — Ambulatory Visit (INDEPENDENT_AMBULATORY_CARE_PROVIDER_SITE_OTHER): Payer: BLUE CROSS/BLUE SHIELD

## 2019-10-02 ENCOUNTER — Other Ambulatory Visit: Payer: Self-pay

## 2019-10-02 DIAGNOSIS — Z23 Encounter for immunization: Secondary | ICD-10-CM | POA: Diagnosis not present

## 2019-11-07 ENCOUNTER — Other Ambulatory Visit: Payer: Self-pay

## 2019-11-08 ENCOUNTER — Ambulatory Visit (INDEPENDENT_AMBULATORY_CARE_PROVIDER_SITE_OTHER): Payer: BLUE CROSS/BLUE SHIELD | Admitting: Family Medicine

## 2019-11-08 ENCOUNTER — Encounter: Payer: Self-pay | Admitting: Family Medicine

## 2019-11-08 VITALS — BP 131/74 | HR 82 | Temp 98.2°F | Ht 72.0 in | Wt 258.2 lb

## 2019-11-08 DIAGNOSIS — K644 Residual hemorrhoidal skin tags: Secondary | ICD-10-CM

## 2019-11-08 DIAGNOSIS — I10 Essential (primary) hypertension: Secondary | ICD-10-CM

## 2019-11-08 DIAGNOSIS — R972 Elevated prostate specific antigen [PSA]: Secondary | ICD-10-CM | POA: Diagnosis not present

## 2019-11-08 DIAGNOSIS — E7800 Pure hypercholesterolemia, unspecified: Secondary | ICD-10-CM

## 2019-11-08 DIAGNOSIS — E559 Vitamin D deficiency, unspecified: Secondary | ICD-10-CM | POA: Diagnosis not present

## 2019-11-08 MED ORDER — HYDROCORTISONE ACETATE 25 MG RE SUPP: 25.0000 mg | Freq: Two times a day (BID) | RECTAL | 0 refills | Status: DC

## 2019-11-08 NOTE — Progress Notes (Addendum)
Subjective:  Patient ID: Jeffrey Barnett, male    DOB: Aug 27, 1961, 59 y.o.   MRN: [id number]  Patient Care Team: Baruch Gouty, FNP as PCP - General (Family Medicine)   Chief Complaint:  Medical Management of Chronic Issues   HPI: Jeffrey Barnett is a 59 y.o. male presenting on 11/08/2019 for Medical Management of Chronic Issues   1. Vitamin D deficiency On Vitamin D repletion therapy and tolerating well.   2. Essential hypertension Compliant with medications without associated side effects. No headache, chest pain, dizziness, weakness, of visual changes. No leg swelling or palpitations.   3. Pure hypercholesterolemia Compliant with medications without associated side effects. Does try to watch diet but finds this hard. Does live a sedentary lifestyle.   4. Elevated PSA Pt was seen by urology and has an upcoming appointment but does not know if he is going to go. Pt denies urinary symptoms. Patient encouraged to keep his urology appointment.     Relevant past medical, surgical, family, and social history reviewed and updated as indicated.  Allergies and medications reviewed and updated. Date reviewed: Chart in Epic.   Past Medical History:  Diagnosis Date  . Fever blister   . Hypertension   . Other and unspecified hyperlipidemia   . Villous adenoma   . Wax in ear     Past Surgical History:  Procedure Laterality Date  . APPENDECTOMY    . SHOULDER SURGERY Left y-3    Social History   Socioeconomic History  . Marital status: Married    Spouse name: Not on file  . Number of children: Not on file  . Years of education: Not on file  . Highest education level: Not on file  Occupational History  . Not on file  Tobacco Use  . Smoking status: Never Smoker  . Smokeless tobacco: Never Used  Substance and Sexual Activity  . Alcohol use: No  . Drug use: No  . Sexual activity: Not on file  Other Topics Concern  . Not on file  Social History Narrative  . Not on file    Social Determinants of Health   Financial Resource Strain:   . Difficulty of Paying Living Expenses: Not on file  Food Insecurity:   . Worried About Charity fundraiser in the Last Year: Not on file  . Ran Out of Food in the Last Year: Not on file  Transportation Needs:   . Lack of Transportation (Medical): Not on file  . Lack of Transportation (Non-Medical): Not on file  Physical Activity:   . Days of Exercise per Week: Not on file  . Minutes of Exercise per Session: Not on file  Stress:   . Feeling of Stress : Not on file  Social Connections:   . Frequency of Communication with Friends and Family: Not on file  . Frequency of Social Gatherings with Friends and Family: Not on file  . Attends Religious Services: Not on file  . Active Member of Clubs or Organizations: Not on file  . Attends Archivist Meetings: Not on file  . Marital Status: Not on file  Intimate Partner Violence:   . Fear of Current or Ex-Partner: Not on file  . Emotionally Abused: Not on file  . Physically Abused: Not on file  . Sexually Abused: Not on file    Outpatient Encounter Medications as of 11/08/2019  Medication Sig  . Cholecalciferol (VITAMIN D) 2000 UNITS CAPS Take 1 capsule by  mouth daily.  . enalapril (VASOTEC) 10 MG tablet Take 1 tablet (10 mg total) by mouth daily.  . fluticasone (FLONASE) 50 MCG/ACT nasal spray Place 2 sprays into both nostrils daily.  . Omega-3 Fatty Acids (FISH OIL) 1000 MG CAPS Take 2 capsules by mouth daily.    . rosuvastatin (CRESTOR) 20 MG tablet Take 1 tablet (20 mg total) by mouth daily.  . sildenafil (REVATIO) 20 MG tablet TAKE 2-5 TABLETS BY MOUTH AS NEEDED PRIOR TO SEXUAL ACTIVITY.  Marland Kitchen valACYclovir (VALTREX) 1000 MG tablet TAKE 1 TABLET BY MOUTH EVERY DAY AS DIRECTED   No facility-administered encounter medications on file as of 11/08/2019.    No Known Allergies  Review of Systems  Constitutional: Negative for activity change, appetite change, chills,  diaphoresis, fatigue, fever and unexpected weight change.  HENT: Negative.   Eyes: Negative.  Negative for photophobia and visual disturbance.  Respiratory: Negative for cough, chest tightness and shortness of breath.   Cardiovascular: Negative for chest pain, palpitations and leg swelling.  Gastrointestinal: Negative for abdominal pain, blood in stool, constipation, diarrhea, nausea and vomiting.       Complains of hemorrhoids  Endocrine: Negative.  Negative for cold intolerance, heat intolerance, polydipsia, polyphagia and polyuria.  Genitourinary: Negative for decreased urine volume, difficulty urinating, dysuria, enuresis, flank pain, frequency, hematuria and urgency.  Musculoskeletal: Negative for arthralgias and myalgias.  Skin: Negative.   Allergic/Immunologic: Negative.   Neurological: Negative for dizziness, tremors, seizures, syncope, facial asymmetry, speech difficulty, weakness, light-headedness, numbness and headaches.  Hematological: Negative.   Psychiatric/Behavioral: Negative for confusion, hallucinations, sleep disturbance and suicidal ideas.  All other systems reviewed and are negative.       Objective:  BP 131/74   Pulse 82   Temp 98.2 F (36.8 C) (Oral)   Ht 6' (1.829 m)   Wt 258 lb 4 oz (117.1 kg)   BMI 35.02 kg/m    Wt Readings from Last 3 Encounters:  11/08/19 258 lb 4 oz (117.1 kg)  09/25/19 257 lb (116.6 kg)  05/12/19 253 lb (114.8 kg)    Physical Exam Vitals and nursing note reviewed.  Constitutional:      General: He is not in acute distress.    Appearance: Normal appearance. He is well-developed and well-groomed. He is obese. He is not ill-appearing, toxic-appearing or diaphoretic.  HENT:     Head: Normocephalic and atraumatic.     Jaw: There is normal jaw occlusion.     Right Ear: Hearing, tympanic membrane, ear canal and external ear normal.     Left Ear: Hearing, tympanic membrane, ear canal and external ear normal.     Nose: Nose normal.       Mouth/Throat:     Lips: Pink.     Mouth: Mucous membranes are moist.     Pharynx: Oropharynx is clear. Uvula midline.  Eyes:     General: Lids are normal.     Extraocular Movements: Extraocular movements intact.     Conjunctiva/sclera: Conjunctivae normal.     Pupils: Pupils are equal, round, and reactive to light.  Neck:     Thyroid: No thyroid mass, thyromegaly or thyroid tenderness.     Vascular: No carotid bruit or JVD.     Trachea: Trachea and phonation normal.  Cardiovascular:     Rate and Rhythm: Normal rate and regular rhythm.     Chest Wall: PMI is not displaced.     Pulses: Normal pulses.     Heart sounds: Normal heart  sounds. No murmur. No friction rub. No gallop.   Pulmonary:     Effort: Pulmonary effort is normal. No respiratory distress.     Breath sounds: Normal breath sounds. No wheezing.  Abdominal:     General: Bowel sounds are normal. There is no distension or abdominal bruit.     Palpations: Abdomen is soft. There is no hepatomegaly or splenomegaly.     Tenderness: There is no abdominal tenderness. There is no right CVA tenderness or left CVA tenderness.     Hernia: No hernia is present.  Musculoskeletal:        General: Normal range of motion.     Cervical back: Normal range of motion and neck supple.     Right lower leg: No edema.     Left lower leg: No edema.  Lymphadenopathy:     Cervical: No cervical adenopathy.  Skin:    General: Skin is warm and dry.     Capillary Refill: Capillary refill takes less than 2 seconds.     Coloration: Skin is not cyanotic, jaundiced or pale.     Findings: No rash.  Neurological:     General: No focal deficit present.     Mental Status: He is alert and oriented to person, place, and time.     Cranial Nerves: Cranial nerves are intact. No cranial nerve deficit.     Sensory: Sensation is intact. No sensory deficit.     Motor: Motor function is intact. No weakness.     Coordination: Coordination is intact.  Coordination normal.     Gait: Gait is intact. Gait normal.     Deep Tendon Reflexes: Reflexes are normal and symmetric. Reflexes normal.  Psychiatric:        Attention and Perception: Attention and perception normal.        Mood and Affect: Mood and affect normal.        Speech: Speech normal.        Behavior: Behavior normal. Behavior is cooperative.        Thought Content: Thought content normal.        Cognition and Memory: Cognition and memory normal.        Judgment: Judgment normal.     Results for orders placed or performed in visit on 05/17/19  PSA, total and free  Result Value Ref Range   Prostate Specific Ag, Serum 3.3 0.0 - 4.0 ng/mL   PSA, Free 0.39 N/A ng/mL   PSA, Free Pct 11.8 %  VITAMIN D 25 Hydroxy (Vit-D Deficiency, Fractures)  Result Value Ref Range   Vit D, 25-Hydroxy 46.2 30.0 - 100.0 ng/mL  Thyroid Panel With TSH  Result Value Ref Range   TSH 1.540 0.450 - 4.500 uIU/mL   T4, Total 7.7 4.5 - 12.0 ug/dL   T3 Uptake Ratio 26 24 - 39 %   Free Thyroxine Index 2.0 1.2 - 4.9  Lipid panel  Result Value Ref Range   Cholesterol, Total 121 100 - 199 mg/dL   Triglycerides 87 0 - 149 mg/dL   HDL 44 >39 mg/dL   VLDL Cholesterol Cal 17 5 - 40 mg/dL   LDL Chol Calc (NIH) 60 0 - 99 mg/dL   Chol/HDL Ratio 2.8 0.0 - 5.0 ratio  CBC with Differential/Platelet  Result Value Ref Range   WBC 5.7 3.4 - 10.8 x10E3/uL   RBC 5.29 4.14 - 5.80 x10E6/uL   Hemoglobin 15.1 13.0 - 17.7 g/dL   Hematocrit 44.9 37.5 - 51.0 %  MCV 85 79 - 97 fL   MCH 28.5 26.6 - 33.0 pg   MCHC 33.6 31.5 - 35.7 g/dL   RDW 13.1 11.6 - 15.4 %   Platelets 222 150 - 450 x10E3/uL   Neutrophils 65 Not Estab. %   Lymphs 25 Not Estab. %   Monocytes 8 Not Estab. %   Eos 1 Not Estab. %   Basos 1 Not Estab. %   Neutrophils Absolute 3.7 1.4 - 7.0 x10E3/uL   Lymphocytes Absolute 1.4 0.7 - 3.1 x10E3/uL   Monocytes Absolute 0.5 0.1 - 0.9 x10E3/uL   EOS (ABSOLUTE) 0.1 0.0 - 0.4 x10E3/uL   Basophils Absolute  0.0 0.0 - 0.2 x10E3/uL   Immature Granulocytes 0 Not Estab. %   Immature Grans (Abs) 0.0 0.0 - 0.1 x10E3/uL  CMP14+EGFR  Result Value Ref Range   Glucose 127 (H) 65 - 99 mg/dL   BUN 19 8 - 27 mg/dL   Creatinine, Ser 0.96 0.76 - 1.27 mg/dL   GFR calc non Af Amer 86 >59 mL/min/1.73   GFR calc Af Amer 99 >59 mL/min/1.73   BUN/Creatinine Ratio 20 10 - 24   Sodium 137 134 - 144 mmol/L   Potassium 4.6 3.5 - 5.2 mmol/L   Chloride 103 96 - 106 mmol/L   CO2 21 20 - 29 mmol/L   Calcium 9.1 8.6 - 10.2 mg/dL   Total Protein 6.7 6.0 - 8.5 g/dL   Albumin 4.4 3.8 - 4.9 g/dL   Globulin, Total 2.3 1.5 - 4.5 g/dL   Albumin/Globulin Ratio 1.9 1.2 - 2.2   Bilirubin Total 0.6 0.0 - 1.2 mg/dL   Alkaline Phosphatase 80 39 - 117 IU/L   AST 23 0 - 40 IU/L   ALT 19 0 - 44 IU/L       Pertinent labs & imaging results that were available during my care of the patient were reviewed by me and considered in my medical decision making.  Assessment & Plan:  Skippy was seen today for medical management of chronic issues.  Diagnoses and all orders for this visit:  Vitamin D deficiency Encouraged changes to current diet to include healthier foods.  Essential hypertension Blood pressure readings normal. Encouraged patient to continue medications. DASH diet and exercise encouraged.   Pure hypercholesterolemia Encouraged heart healthy and high fiber diet. Continue medications as prescribed.   Elevated PSA Informed patient that PSA was still elevated and to keep urology appointment.  Pt has external hemorrhoid, Anusol prn, max of 5 days treatment.    Continue all other maintenance medications.  Follow up plan: Return in about 6 months (around 05/10/2020), or if symptoms worsen or fail to improve.  Continue healthy lifestyle choices, including diet (rich in fruits, vegetables, and lean proteins, and low in salt and simple carbohydrates) and exercise (at least 30 minutes of moderate physical activity  daily).  Educational handout given for hemorrhoids.  The above assessment and management plan was discussed with the patient. The patient verbalized understanding of and has agreed to the management plan. Patient is aware to call the clinic if they develop any new symptoms or if symptoms persist or worsen. Patient is aware when to return to the clinic for a follow-up visit. Patient educated on when it is appropriate to go to the emergency department.   Robynn Pane, FNP student [facility] [phone]  I personally was present during the history, physical exam, and medical decision-making activities of this service and have verified that the  service and findings are accurately documented in the nurse practitioner student's note.  Monia Pouch, FNP-C [facility] [address] [place], [place] [address] [phone]

## 2019-11-08 NOTE — Patient Instructions (Signed)
Hemorrhoids Hemorrhoids are swollen veins that may develop:  In the butt (rectum). These are called internal hemorrhoids.  Around the opening of the butt (anus). These are called external hemorrhoids. Hemorrhoids can cause pain, itching, or bleeding. Most of the time, they do not cause serious problems. They usually get better with diet changes, lifestyle changes, and other home treatments. What are the causes? This condition may be caused by:  Having trouble pooping (constipation).  Pushing hard (straining) to poop.  Watery poop (diarrhea).  Pregnancy.  Being very overweight (obese).  Sitting for long periods of time.  Heavy lifting or other activity that causes you to strain.  Anal sex.  Riding a bike for a long period of time. What are the signs or symptoms? Symptoms of this condition include:  Pain.  Itching or soreness in the butt.  Bleeding from the butt.  Leaking poop.  Swelling in the area.  One or more lumps around the opening of your butt. How is this diagnosed? A doctor can often diagnose this condition by looking at the affected area. The doctor may also:  Do an exam that involves feeling the area with a gloved hand (digital rectal exam).  Examine the area inside your butt using a small tube (anoscope).  Order blood tests. This may be done if you have lost a lot of blood.  Have you get a test that involves looking inside the colon using a flexible tube with a camera on the end (sigmoidoscopy or colonoscopy). How is this treated? This condition can usually be treated at home. Your doctor may tell you to change what you eat, make lifestyle changes, or try home treatments. If these do not help, procedures can be done to remove the hemorrhoids or make them smaller. These may involve:  Placing rubber bands at the base of the hemorrhoids to cut off their blood supply.  Injecting medicine into the hemorrhoids to shrink them.  Shining a type of light  energy onto the hemorrhoids to cause them to fall off.  Doing surgery to remove the hemorrhoids or cut off their blood supply. Follow these instructions at home: Eating and drinking   Eat foods that have a lot of fiber in them. These include whole grains, beans, nuts, fruits, and vegetables.  Ask your doctor about taking products that have added fiber (fibersupplements).  Reduce the amount of fat in your diet. You can do this by: ? Eating low-fat dairy products. ? Eating less red meat. ? Avoiding processed foods.  Drink enough fluid to keep your pee (urine) pale yellow. Managing pain and swelling   Take a warm-water bath (sitz bath) for 20 minutes to ease pain. Do this 3-4 times a day. You may do this in a bathtub or using a portable sitz bath that fits over the toilet.  If told, put ice on the painful area. It may be helpful to use ice between your warm baths. ? Put ice in a plastic bag. ? Place a towel between your skin and the bag. ? Leave the ice on for 20 minutes, 2-3 times a day. General instructions  Take over-the-counter and prescription medicines only as told by your doctor. ? Medicated creams and medicines may be used as told.  Exercise often. Ask your doctor how much and what kind of exercise is best for you.  Go to the bathroom when you have the urge to poop. Do not wait.  Avoid pushing too hard when you poop.  Keep your   butt dry and clean. Use wet toilet paper or moist towelettes after pooping.  Do not sit on the toilet for a long time.  Keep all follow-up visits as told by your doctor. This is important. Contact a doctor if you:  Have pain and swelling that do not get better with treatment or medicine.  Have trouble pooping.  Cannot poop.  Have pain or swelling outside the area of the hemorrhoids. Get help right away if you have:  Bleeding that will not stop. Summary  Hemorrhoids are swollen veins in the butt or around the opening of the  butt.  They can cause pain, itching, or bleeding.  Eat foods that have a lot of fiber in them. These include whole grains, beans, nuts, fruits, and vegetables.  Take a warm-water bath (sitz bath) for 20 minutes to ease pain. Do this 3-4 times a day. This information is not intended to replace advice given to you by your health care provider. Make sure you discuss any questions you have with your health care provider. Document Revised: 09/01/2018 Document Reviewed: 01/13/2018 Elsevier Patient Education  2020 [employer]  

## 2019-11-09 ENCOUNTER — Ambulatory Visit: Payer: BLUE CROSS/BLUE SHIELD | Admitting: Family Medicine

## 2019-11-10 ENCOUNTER — Other Ambulatory Visit: Payer: Self-pay

## 2019-11-10 ENCOUNTER — Other Ambulatory Visit: Payer: BLUE CROSS/BLUE SHIELD

## 2019-11-10 DIAGNOSIS — I10 Essential (primary) hypertension: Secondary | ICD-10-CM | POA: Diagnosis not present

## 2019-11-10 DIAGNOSIS — E7800 Pure hypercholesterolemia, unspecified: Secondary | ICD-10-CM | POA: Diagnosis not present

## 2019-11-11 LAB — CMP14+EGFR
ALT: 19 IU/L (ref 0–44)
AST: 26 IU/L (ref 0–40)
Albumin/Globulin Ratio: 1.8 (ref 1.2–2.2)
Albumin: 4.4 g/dL (ref 3.8–4.8)
Alkaline Phosphatase: 78 IU/L (ref 39–117)
BUN/Creatinine Ratio: 15 (ref 10–24)
BUN: 17 mg/dL (ref 8–27)
Bilirubin Total: 0.6 mg/dL (ref 0.0–1.2)
CO2: 22 mmol/L (ref 20–29)
Calcium: 9.1 mg/dL (ref 8.6–10.2)
Chloride: 105 mmol/L (ref 96–106)
Creatinine, Ser: 1.1 mg/dL (ref 0.76–1.27)
GFR calc Af Amer: 83 mL/min/{1.73_m2} (ref >59)
GFR calc non Af Amer: 72 mL/min/{1.73_m2} (ref >59)
Globulin, Total: 2.5 g/dL (ref 1.5–4.5)
Glucose: 129 mg/dL — ABNORMAL HIGH (ref 65–99)
Potassium: 5 mmol/L (ref 3.5–5.2)
Sodium: 141 mmol/L (ref 134–144)
Total Protein: 6.9 g/dL (ref 6.0–8.5)

## 2019-11-11 LAB — CBC WITH DIFFERENTIAL/PLATELET
Basophils Absolute: 0 10*3/uL (ref 0.0–0.2)
Basos: 1 %
EOS (ABSOLUTE): 0.1 10*3/uL (ref 0.0–0.4)
Eos: 1 %
Hematocrit: 46 % (ref 37.5–51.0)
Hemoglobin: 15.3 g/dL (ref 13.0–17.7)
Immature Grans (Abs): 0 10*3/uL (ref 0.0–0.1)
Immature Granulocytes: 0 %
Lymphocytes Absolute: 1.5 10*3/uL (ref 0.7–3.1)
Lymphs: 26 %
MCH: 29.8 pg (ref 26.6–33.0)
MCHC: 33.3 g/dL (ref 31.5–35.7)
MCV: 90 fL (ref 79–97)
Monocytes Absolute: 0.6 10*3/uL (ref 0.1–0.9)
Monocytes: 10 %
Neutrophils Absolute: 3.6 10*3/uL (ref 1.4–7.0)
Neutrophils: 62 %
Platelets: 226 10*3/uL (ref 150–450)
RBC: 5.13 x10E6/uL (ref 4.14–5.80)
RDW: 13 % (ref 11.6–15.4)
WBC: 5.8 10*3/uL (ref 3.4–10.8)

## 2019-11-11 LAB — LIPID PANEL
Chol/HDL Ratio: 2.7 ratio (ref 0.0–5.0)
Cholesterol, Total: 117 mg/dL (ref 100–199)
HDL: 44 mg/dL (ref >39)
LDL Chol Calc (NIH): 57 mg/dL (ref 0–99)
Triglycerides: 77 mg/dL (ref 0–149)
VLDL Cholesterol Cal: 16 mg/dL (ref 5–40)

## 2019-11-11 LAB — THYROID PANEL WITH TSH
Free Thyroxine Index: 2.2 (ref 1.2–4.9)
T3 Uptake Ratio: 28 % (ref 24–39)
T4, Total: 7.9 ug/dL (ref 4.5–12.0)
TSH: 1.87 u[IU]/mL (ref 0.450–4.500)

## 2019-11-14 ENCOUNTER — Other Ambulatory Visit: Payer: Self-pay | Admitting: Family Medicine

## 2019-11-14 NOTE — Telephone Encounter (Signed)
Last office visit 11/08/2019 Last refill 03/29/2018, #50, 1 refill

## 2019-12-04 ENCOUNTER — Other Ambulatory Visit: Payer: Self-pay | Admitting: Family Medicine

## 2019-12-04 MED ORDER — SILDENAFIL CITRATE 20 MG PO TABS: ORAL_TABLET | ORAL | 0 refills | Status: DC

## 2019-12-14 ENCOUNTER — Other Ambulatory Visit: Payer: Self-pay | Admitting: Family Medicine

## 2019-12-14 DIAGNOSIS — I10 Essential (primary) hypertension: Secondary | ICD-10-CM

## 2020-01-02 ENCOUNTER — Other Ambulatory Visit: Payer: Self-pay | Admitting: Family Medicine

## 2020-01-02 NOTE — Telephone Encounter (Signed)
From pharmacy: [facility]  PATIENT IS REQUESTING 100 TABLET'S FOR A PRICE DISCOUNT, THANK YOU!! 01/02/2020 9:55:58 AM

## 2020-02-09 ENCOUNTER — Encounter: Payer: Self-pay | Admitting: *Deleted

## 2020-05-17 ENCOUNTER — Ambulatory Visit: Payer: BLUE CROSS/BLUE SHIELD | Admitting: Family Medicine

## 2020-05-20 ENCOUNTER — Ambulatory Visit: Payer: BLUE CROSS/BLUE SHIELD | Admitting: Family Medicine

## 2020-05-21 ENCOUNTER — Ambulatory Visit: Payer: BLUE CROSS/BLUE SHIELD | Admitting: Family Medicine

## 2020-06-24 ENCOUNTER — Ambulatory Visit (INDEPENDENT_AMBULATORY_CARE_PROVIDER_SITE_OTHER): Payer: BLUE CROSS/BLUE SHIELD | Admitting: Family Medicine

## 2020-06-24 ENCOUNTER — Other Ambulatory Visit: Payer: Self-pay

## 2020-06-24 VITALS — BP 114/76 | HR 82 | Wt 262.0 lb

## 2020-06-24 DIAGNOSIS — R739 Hyperglycemia, unspecified: Secondary | ICD-10-CM | POA: Diagnosis not present

## 2020-06-24 DIAGNOSIS — K640 First degree hemorrhoids: Secondary | ICD-10-CM

## 2020-06-24 DIAGNOSIS — Z23 Encounter for immunization: Secondary | ICD-10-CM | POA: Diagnosis not present

## 2020-06-24 DIAGNOSIS — I10 Essential (primary) hypertension: Secondary | ICD-10-CM | POA: Diagnosis not present

## 2020-06-24 DIAGNOSIS — E7800 Pure hypercholesterolemia, unspecified: Secondary | ICD-10-CM | POA: Diagnosis not present

## 2020-06-24 DIAGNOSIS — Z7689 Persons encountering health services in other specified circumstances: Secondary | ICD-10-CM

## 2020-06-24 LAB — BAYER DCA HB A1C WAIVED: HB A1C (BAYER DCA - WAIVED): 6.6 % (ref <7.0)

## 2020-06-24 MED ORDER — ROSUVASTATIN CALCIUM 20 MG PO TABS: 20.0000 mg | ORAL_TABLET | ORAL | 1 refills | Status: DC

## 2020-06-24 MED ORDER — HYDROCORTISONE (PERIANAL) 2.5 % EX CREA: 1.0000 "application " | TOPICAL_CREAM | Freq: Two times a day (BID) | CUTANEOUS | 0 refills | Status: DC

## 2020-06-24 NOTE — Patient Instructions (Signed)
Hemorrhoids Hemorrhoids are swollen veins that may develop:  In the butt (rectum). These are called internal hemorrhoids.  Around the opening of the butt (anus). These are called external hemorrhoids. Hemorrhoids can cause pain, itching, or bleeding. Most of the time, they do not cause serious problems. They usually get better with diet changes, lifestyle changes, and other home treatments. What are the causes? This condition may be caused by:  Having trouble pooping (constipation).  Pushing hard (straining) to poop.  Watery poop (diarrhea).  Pregnancy.  Being very overweight (obese).  Sitting for long periods of time.  Heavy lifting or other activity that causes you to strain.  Anal sex.  Riding a bike for a long period of time. What are the signs or symptoms? Symptoms of this condition include:  Pain.  Itching or soreness in the butt.  Bleeding from the butt.  Leaking poop.  Swelling in the area.  One or more lumps around the opening of your butt. How is this diagnosed? A doctor can often diagnose this condition by looking at the affected area. The doctor may also:  Do an exam that involves feeling the area with a gloved hand (digital rectal exam).  Examine the area inside your butt using a small tube (anoscope).  Order blood tests. This may be done if you have lost a lot of blood.  Have you get a test that involves looking inside the colon using a flexible tube with a camera on the end (sigmoidoscopy or colonoscopy). How is this treated? This condition can usually be treated at home. Your doctor may tell you to change what you eat, make lifestyle changes, or try home treatments. If these do not help, procedures can be done to remove the hemorrhoids or make them smaller. These may involve:  Placing rubber bands at the base of the hemorrhoids to cut off their blood supply.  Injecting medicine into the hemorrhoids to shrink them.  Shining a type of light  energy onto the hemorrhoids to cause them to fall off.  Doing surgery to remove the hemorrhoids or cut off their blood supply. Follow these instructions at home: Eating and drinking   Eat foods that have a lot of fiber in them. These include whole grains, beans, nuts, fruits, and vegetables.  Ask your doctor about taking products that have added fiber (fibersupplements).  Reduce the amount of fat in your diet. You can do this by: ? Eating low-fat dairy products. ? Eating less red meat. ? Avoiding processed foods.  Drink enough fluid to keep your pee (urine) pale yellow. Managing pain and swelling   Take a warm-water bath (sitz bath) for 20 minutes to ease pain. Do this 3-4 times a day. You may do this in a bathtub or using a portable sitz bath that fits over the toilet.  If told, put ice on the painful area. It may be helpful to use ice between your warm baths. ? Put ice in a plastic bag. ? Place a towel between your skin and the bag. ? Leave the ice on for 20 minutes, 2-3 times a day. General instructions  Take over-the-counter and prescription medicines only as told by your doctor. ? Medicated creams and medicines may be used as told.  Exercise often. Ask your doctor how much and what kind of exercise is best for you.  Go to the bathroom when you have the urge to poop. Do not wait.  Avoid pushing too hard when you poop.  Keep your   butt dry and clean. Use wet toilet paper or moist towelettes after pooping.  Do not sit on the toilet for a long time.  Keep all follow-up visits as told by your doctor. This is important. Contact a doctor if you:  Have pain and swelling that do not get better with treatment or medicine.  Have trouble pooping.  Cannot poop.  Have pain or swelling outside the area of the hemorrhoids. Get help right away if you have:  Bleeding that will not stop. Summary  Hemorrhoids are swollen veins in the butt or around the opening of the  butt.  They can cause pain, itching, or bleeding.  Eat foods that have a lot of fiber in them. These include whole grains, beans, nuts, fruits, and vegetables.  Take a warm-water bath (sitz bath) for 20 minutes to ease pain. Do this 3-4 times a day. This information is not intended to replace advice given to you by your health care provider. Make sure you discuss any questions you have with your health care provider. Document Revised: 09/01/2018 Document Reviewed: 01/13/2018 Elsevier Patient Education  2020 [employer]  

## 2020-06-24 NOTE — Progress Notes (Signed)
Subjective: CC: Establish care, hypertension with hyperlipidemia PCP: Janora Norlander, DO Jeffrey Barnett is a 59 y.o. male presenting to clinic today for:  1.  Hypertension with hyperlipidemia Patient reports compliance with his medications.  He notes that he does take the Crestor every other day, not every day.  He denies any chest pain, shortness of breath, edema or visual disturbance  2.  Hemorrhoids Patient reports hemorrhoids with some leakage.  He does admit to weight gain.  He denies any constipation but admits he can drink more water and eat more fiber.  Does not report any rectal bleeding.  He sees Dr. Paulita Fujita with [facility] gastroenterology.  3.  Elevated serum glucose Patient reports that he was fasting for his labs in March.  Does not report any polyuria or polydipsia.  No known diagnosis of diabetes.  Last A1c was measured several years ago and was 5.7.   ROS: Per HPI  No Known Allergies Past Medical History:  Diagnosis Date  . Fever blister   . Hypertension   . Other and unspecified hyperlipidemia   . Villous adenoma   . Wax in ear     Current Outpatient Medications:  .  Cholecalciferol (VITAMIN D) 2000 UNITS CAPS, Take 1 capsule by mouth daily., Disp: , Rfl:  .  enalapril (VASOTEC) 10 MG tablet, TAKE 1 TABLET BY MOUTH EVERY DAY, Disp: 90 tablet, Rfl: 1 .  hydrocortisone (ANUSOL-HC) 25 MG suppository, Place 1 suppository (25 mg total) rectally 2 (two) times daily., Disp: 12 suppository, Rfl: 0 .  Omega-3 Fatty Acids (FISH OIL) 1000 MG CAPS, Take 2 capsules by mouth daily.  , Disp: , Rfl:  .  sildenafil (REVATIO) 20 MG tablet, TAKE 2-5 TABLETS BY MOUTH AS NEEDED PRIOR TO SEXUAL ACTIVITY., Disp: 100 tablet, Rfl: 0 .  valACYclovir (VALTREX) 1000 MG tablet, TAKE 1 TABLET BY MOUTH EVERY DAY AS DIRECTED, Disp: 90 tablet, Rfl: 0 .  fluticasone (FLONASE) 50 MCG/ACT nasal spray, Place 2 sprays into both nostrils daily. (Patient not taking: Reported on 06/24/2020), Disp:  16 g, Rfl: 6 .  rosuvastatin (CRESTOR) 20 MG tablet, Take 1 tablet (20 mg total) by mouth daily., Disp: 90 tablet, Rfl: 1 Social History   Socioeconomic History  . Marital status: Married    Spouse name: Not on file  . Number of children: Not on file  . Years of education: Not on file  . Highest education level: Not on file  Occupational History  . Not on file  Tobacco Use  . Smoking status: Never Smoker  . Smokeless tobacco: Never Used  Vaping Use  . Vaping Use: Never used  Substance and Sexual Activity  . Alcohol use: No  . Drug use: No  . Sexual activity: Not on file  Other Topics Concern  . Not on file  Social History Narrative  . Not on file   Social Determinants of Health   Financial Resource Strain:   . Difficulty of Paying Living Expenses: Not on file  Food Insecurity:   . Worried About Charity fundraiser in the Last Year: Not on file  . Ran Out of Food in the Last Year: Not on file  Transportation Needs:   . Lack of Transportation (Medical): Not on file  . Lack of Transportation (Non-Medical): Not on file  Physical Activity:   . Days of Exercise per Week: Not on file  . Minutes of Exercise per Session: Not on file  Stress:   . Feeling  of Stress : Not on file  Social Connections:   . Frequency of Communication with Friends and Family: Not on file  . Frequency of Social Gatherings with Friends and Family: Not on file  . Attends Religious Services: Not on file  . Active Member of Clubs or Organizations: Not on file  . Attends Archivist Meetings: Not on file  . Marital Status: Not on file  Intimate Partner Violence:   . Fear of Current or Ex-Partner: Not on file  . Emotionally Abused: Not on file  . Physically Abused: Not on file  . Sexually Abused: Not on file   Family History  Problem Relation Age of Onset  . Dementia Mother   . Thyroid disease Father     Objective: Office vital signs reviewed. BP 114/76   Pulse 82   Wt 262 lb (118.8  kg)   SpO2 97%   BMI 35.53 kg/m   Physical Examination:  General: Awake, alert, obese, well nourished, No acute distress HEENT: Normal; sclera white.  No carotid bruits Cardio: regular rate and rhythm, S1S2 heard, no murmurs appreciated Pulm: clear to auscultation bilaterally, no wheezes, rhonchi or rales; normal work of breathing on room air Extremities: warm, well perfused, No edema, cyanosis or clubbing; +2 pulses bilaterally  Assessment/ Plan: 59 y.o. male   1. Essential hypertension Well-controlled.  Continue current regimen  2. Pure hypercholesterolemia I have adjusted his prescription to reflect current usage.  Continue every other day dosing.  Plan for fasting lipid panel in 6 months - rosuvastatin (CRESTOR) 20 MG tablet; Take 1 tablet (20 mg total) by mouth every other day.  Dispense: 90 tablet; Refill: 1  3. Establishing care with new doctor, encounter for  4. Elevated serum glucose Noted to be elevated and he was fasting at that visit.  Has had weight gain.  Check A1c - Bayer DCA Hb A1c Waived  5. Grade I hemorrhoids Proctoscopy stone prescribed.  He will follow-up as needed on this issue with his gastroenterologist if does not resolve - hydrocortisone (PROCTOZONE-HC) 2.5 % rectal cream; Place 1 application rectally 2 (two) times daily. x5-7 days per flare  Dispense: 30 g; Refill: 0   No orders of the defined types were placed in this encounter.  No orders of the defined types were placed in this encounter.    Janora Norlander, DO [place] [phone]

## 2020-07-14 ENCOUNTER — Other Ambulatory Visit: Payer: Self-pay | Admitting: Family Medicine

## 2020-07-14 DIAGNOSIS — I10 Essential (primary) hypertension: Secondary | ICD-10-CM

## 2020-10-28 ENCOUNTER — Ambulatory Visit: Payer: BLUE CROSS/BLUE SHIELD | Admitting: Family Medicine

## 2020-12-04 ENCOUNTER — Ambulatory Visit: Payer: BLUE CROSS/BLUE SHIELD | Admitting: Family Medicine

## 2021-01-15 ENCOUNTER — Ambulatory Visit: Payer: BLUE CROSS/BLUE SHIELD | Admitting: Family Medicine

## 2021-01-17 ENCOUNTER — Other Ambulatory Visit: Payer: Self-pay | Admitting: Family Medicine

## 2021-01-17 DIAGNOSIS — I10 Essential (primary) hypertension: Secondary | ICD-10-CM

## 2021-01-20 ENCOUNTER — Other Ambulatory Visit: Payer: Self-pay | Admitting: *Deleted

## 2021-01-20 MED ORDER — SILDENAFIL CITRATE 20 MG PO TABS: ORAL_TABLET | ORAL | 0 refills | Status: DC

## 2021-01-21 ENCOUNTER — Encounter: Payer: Self-pay | Admitting: Family Medicine

## 2021-01-22 ENCOUNTER — Other Ambulatory Visit: Payer: Self-pay

## 2021-01-22 ENCOUNTER — Ambulatory Visit (INDEPENDENT_AMBULATORY_CARE_PROVIDER_SITE_OTHER): Payer: BLUE CROSS/BLUE SHIELD | Admitting: Family Medicine

## 2021-01-22 ENCOUNTER — Encounter: Payer: Self-pay | Admitting: Family Medicine

## 2021-01-22 VITALS — BP 125/73 | HR 81 | Temp 97.5°F | Ht 72.0 in | Wt 261.0 lb

## 2021-01-22 DIAGNOSIS — L989 Disorder of the skin and subcutaneous tissue, unspecified: Secondary | ICD-10-CM | POA: Diagnosis not present

## 2021-01-22 NOTE — Progress Notes (Signed)
Acute Office Visit  Subjective:    Patient ID: Jeffrey Barnett, male    DOB: 04/05/1961, 60 y.o.   MRN: [id number]  Chief Complaint  Patient presents with  . Skin Problem    HPI Patient is in today for red bump under his eye x 4 days. It is a little tender if touched. Denies drainage, itching, fever, or spreading redness. His brother has had cancerous lesions removed so he is worried about skin cancer.   Past Medical History:  Diagnosis Date  . Fever blister   . Hypertension   . Other and unspecified hyperlipidemia   . Villous adenoma   . Wax in ear     Past Surgical History:  Procedure Laterality Date  . APPENDECTOMY    . SHOULDER SURGERY Left y-3    Family History  Problem Relation Age of Onset  . Dementia Mother   . Thyroid disease Father     Social History   Socioeconomic History  . Marital status: Married    Spouse name: Not on file  . Number of children: Not on file  . Years of education: Not on file  . Highest education level: Not on file  Occupational History  . Not on file  Tobacco Use  . Smoking status: Never Smoker  . Smokeless tobacco: Never Used  Vaping Use  . Vaping Use: Never used  Substance and Sexual Activity  . Alcohol use: No  . Drug use: No  . Sexual activity: Not on file  Other Topics Concern  . Not on file  Social History Narrative  . Not on file   Social Determinants of Health   Financial Resource Strain: Not on file  Food Insecurity: Not on file  Transportation Needs: Not on file  Physical Activity: Not on file  Stress: Not on file  Social Connections: Not on file  Intimate Partner Violence: Not on file    Outpatient Medications Prior to Visit  Medication Sig Dispense Refill  . Cholecalciferol (VITAMIN D) 2000 UNITS CAPS Take 1 capsule by mouth daily.    . enalapril (VASOTEC) 10 MG tablet TAKE 1 TABLET BY MOUTH EVERY DAY 90 tablet 0  . fluticasone (FLONASE) 50 MCG/ACT nasal spray Place 2 sprays into both nostrils daily.  16 g 6  . hydrocortisone (ANUSOL-HC) 25 MG suppository Place 1 suppository (25 mg total) rectally 2 (two) times daily. 12 suppository 0  . hydrocortisone (PROCTOZONE-HC) 2.5 % rectal cream Place 1 application rectally 2 (two) times daily. x5-7 days per flare 30 g 0  . Omega-3 Fatty Acids (FISH OIL) 1000 MG CAPS Take 2 capsules by mouth daily.    . rosuvastatin (CRESTOR) 20 MG tablet Take 1 tablet (20 mg total) by mouth every other day. 90 tablet 1  . sildenafil (REVATIO) 20 MG tablet Take 2-5 tablets as needed once daily for sexual activity 100 tablet 0  . valACYclovir (VALTREX) 1000 MG tablet TAKE 1 TABLET BY MOUTH EVERY DAY AS DIRECTED 90 tablet 0   No facility-administered medications prior to visit.    No Known Allergies  Review of Systems As per HPI.     Objective:    Physical Exam Vitals and nursing note reviewed.  Constitutional:      General: He is not in acute distress.    Appearance: Normal appearance. He is not ill-appearing, toxic-appearing or diaphoretic.  HENT:     Head: Normocephalic and atraumatic.   Skin:    General: Skin is warm and dry.  Neurological:     General: No focal deficit present.     Mental Status: He is alert and oriented to person, place, and time.  Psychiatric:        Mood and Affect: Mood normal.        Behavior: Behavior normal.     BP 125/73   Pulse 81   Temp (!) 97.5 F (36.4 C) (Temporal)   Ht 6' (1.829 m)   Wt 261 lb (118.4 kg)   BMI 35.40 kg/m  Wt Readings from Last 3 Encounters:  01/22/21 261 lb (118.4 kg)  06/24/20 262 lb (118.8 kg)  11/08/19 258 lb 4 oz (117.1 kg)    Health Maintenance Due  Topic Date Due  . HIV Screening  Never done  . Hepatitis C Screening  Never done  . COVID-19 Vaccine (3 - Booster for Moderna series) 07/17/2020    There are no preventive care reminders to display for this patient.   Lab Results  Component Value Date   TSH 1.870 11/10/2019   Lab Results  Component Value Date   WBC 5.8  11/10/2019   HGB 15.3 11/10/2019   HCT 46.0 11/10/2019   MCV 90 11/10/2019   PLT 226 11/10/2019   Lab Results  Component Value Date   NA 141 11/10/2019   K 5.0 11/10/2019   CO2 22 11/10/2019   GLUCOSE 129 (H) 11/10/2019   BUN 17 11/10/2019   CREATININE 1.10 11/10/2019   BILITOT 0.6 11/10/2019   ALKPHOS 78 11/10/2019   AST 26 11/10/2019   ALT 19 11/10/2019   PROT 6.9 11/10/2019   ALBUMIN 4.4 11/10/2019   CALCIUM 9.1 11/10/2019   Lab Results  Component Value Date   CHOL 117 11/10/2019   Lab Results  Component Value Date   HDL 44 11/10/2019   Lab Results  Component Value Date   LDLCALC 57 11/10/2019   Lab Results  Component Value Date   TRIG 77 11/10/2019   Lab Results  Component Value Date   CHOLHDL 2.7 11/10/2019   Lab Results  Component Value Date   HGBA1C 6.6 06/24/2020       Assessment & Plan:   Corney was seen today for skin problem.  Diagnoses and all orders for this visit:  Skin lesion of face Present x 4 days. Appears to be an acne lesion, reassurance provided. Notify if lesion does not resolve, will refer to dermatology if needed.   The patient indicates understanding of these issues and agrees with the plan.  Gwenlyn Perking, FNP

## 2021-02-28 ENCOUNTER — Ambulatory Visit (INDEPENDENT_AMBULATORY_CARE_PROVIDER_SITE_OTHER): Payer: BLUE CROSS/BLUE SHIELD | Admitting: Family Medicine

## 2021-02-28 ENCOUNTER — Other Ambulatory Visit: Payer: Self-pay

## 2021-02-28 VITALS — BP 123/78 | HR 94 | Temp 97.7°F | Ht 72.0 in | Wt 263.2 lb

## 2021-02-28 DIAGNOSIS — E7800 Pure hypercholesterolemia, unspecified: Secondary | ICD-10-CM

## 2021-02-28 DIAGNOSIS — L821 Other seborrheic keratosis: Secondary | ICD-10-CM | POA: Diagnosis not present

## 2021-02-28 DIAGNOSIS — I10 Essential (primary) hypertension: Secondary | ICD-10-CM | POA: Diagnosis not present

## 2021-02-28 DIAGNOSIS — K640 First degree hemorrhoids: Secondary | ICD-10-CM | POA: Diagnosis not present

## 2021-02-28 DIAGNOSIS — Z6835 Body mass index (BMI) 35.0-35.9, adult: Secondary | ICD-10-CM

## 2021-02-28 NOTE — Progress Notes (Signed)
Subjective: CC: HTN, HLD PCP: Janora Norlander, DO GGE:ZMOQH D Hackenberg is a 60 y.o. male presenting to clinic today for:  1.  Hypertension with hyperlipidemia Patient is compliant with medications.  No refills needed.  No chest pain, shortness of breath.  Sometimes he feels like he has to urinate more often but does not wake up more than once per night to urinate.  He is walking usually about 1 mile in the evenings with his wife.  He also golfs regularly.  He does not wish to lose some weight and is actively working on this.  Does not eat much red meat but admits that he could probably have a stricter diet.  2.  Skin lesion Patient has a spot on his left shoulder that he would like me to look at.  Does not report any spontaneous bleeding but there is a family history of nonmelanoma skin cancer and he wanted to make sure this was nothing to be concerned about.  He spends time golfing and admits to getting a lot of sun  3.  Hemorrhoids Patient reports hemorrhoids have been stable and has not needed any hydrocortisone as of late.   ROS: Per HPI  No Known Allergies Past Medical History:  Diagnosis Date   Fever blister    Hypertension    Other and unspecified hyperlipidemia    Villous adenoma    Wax in ear     Current Outpatient Medications:    Cholecalciferol (VITAMIN D) 2000 UNITS CAPS, Take 1 capsule by mouth daily., Disp: , Rfl:    enalapril (VASOTEC) 10 MG tablet, TAKE 1 TABLET BY MOUTH EVERY DAY, Disp: 90 tablet, Rfl: 0   fluticasone (FLONASE) 50 MCG/ACT nasal spray, Place 2 sprays into both nostrils daily., Disp: 16 g, Rfl: 6   Omega-3 Fatty Acids (FISH OIL) 1000 MG CAPS, Take 2 capsules by mouth daily., Disp: , Rfl:    rosuvastatin (CRESTOR) 20 MG tablet, Take 1 tablet (20 mg total) by mouth every other day., Disp: 90 tablet, Rfl: 1   sildenafil (REVATIO) 20 MG tablet, Take 2-5 tablets as needed once daily for sexual activity, Disp: 100 tablet, Rfl: 0   valACYclovir  (VALTREX) 1000 MG tablet, TAKE 1 TABLET BY MOUTH EVERY DAY AS DIRECTED, Disp: 90 tablet, Rfl: 0 Social History   Socioeconomic History   Marital status: Married    Spouse name: Not on file   Number of children: Not on file   Years of education: Not on file   Highest education level: Not on file  Occupational History   Not on file  Tobacco Use   Smoking status: Never   Smokeless tobacco: Never  Vaping Use   Vaping Use: Never used  Substance and Sexual Activity   Alcohol use: No   Drug use: No   Sexual activity: Not on file  Other Topics Concern   Not on file  Social History Narrative   Not on file   Social Determinants of Health   Financial Resource Strain: Not on file  Food Insecurity: Not on file  Transportation Needs: Not on file  Physical Activity: Not on file  Stress: Not on file  Social Connections: Not on file  Intimate Partner Violence: Not on file   Family History  Problem Relation Age of Onset   Dementia Mother    Thyroid disease Father     Objective: Office vital signs reviewed. BP 123/78   Pulse 94   Temp 97.7 F (36.5 C)  Ht 6' (1.829 m)   Wt 263 lb 3.2 oz (119.4 kg)   SpO2 98%   BMI 35.70 kg/m   Physical Examination:  General: Awake, alert, obese, No acute distress HEENT: Normal; sclera white Cardio: regular rate and rhythm, S1S2 heard, no murmurs appreciated Pulm: clear to auscultation bilaterally, no wheezes, rhonchi or rales; normal work of breathing on room air Extremities: warm, well perfused, No edema, cyanosis or clubbing; +2 pulses bilaterally MSK: Normal gait and tone Skin: Seborrheic keratosis noted along the left posterior shoulder.  He has many nevi noted along the upper extremities bilaterally.  Assessment/ Plan: 60 y.o. male   Essential hypertension  Pure hypercholesterolemia  Grade I hemorrhoids  Seborrheic keratoses  Morbid obesity (HCC)  BMI 35.0-35.9,adult  Blood pressure under excellent control.  No changes.   No refills needed  Not due for fasting lipid panel.  Continue statin.  Plan for fasting lipid panel at next visit.  Reinforced weight loss and he is actively working on this with his wife.  I offered him referral to dietitian and/or discussion for possible pharmacologic intervention.  He will consider this and get back to me  Hemorrhoids are well controlled and have not flared.  No need for renewal on hemorrhoid medicines  Lesion of concern was a seborrheic keratosis.  This is benign.  Advised to use sunscreen as he does often spend time outside golfing.  No orders of the defined types were placed in this encounter.  No orders of the defined types were placed in this encounter.    Janora Norlander, DO [place] [phone]

## 2021-04-20 ENCOUNTER — Other Ambulatory Visit: Payer: Self-pay | Admitting: Family Medicine

## 2021-04-20 DIAGNOSIS — I10 Essential (primary) hypertension: Secondary | ICD-10-CM

## 2021-07-19 ENCOUNTER — Other Ambulatory Visit: Payer: Self-pay | Admitting: Family Medicine

## 2021-07-19 DIAGNOSIS — I10 Essential (primary) hypertension: Secondary | ICD-10-CM

## 2021-07-19 DIAGNOSIS — E7800 Pure hypercholesterolemia, unspecified: Secondary | ICD-10-CM

## 2021-08-14 ENCOUNTER — Other Ambulatory Visit: Payer: Self-pay | Admitting: Family Medicine

## 2021-08-14 DIAGNOSIS — E7800 Pure hypercholesterolemia, unspecified: Secondary | ICD-10-CM

## 2021-08-14 DIAGNOSIS — I10 Essential (primary) hypertension: Secondary | ICD-10-CM

## 2021-08-14 NOTE — Telephone Encounter (Signed)
Gottschalk. NTBS 30 days given 07/21/21

## 2021-08-15 NOTE — Telephone Encounter (Signed)
Pt made appt for med refill with Dr. Darnell Level., first available with her 09/17/2021 would like a refill sent to [facility] he is out of these meds

## 2021-09-17 ENCOUNTER — Ambulatory Visit (INDEPENDENT_AMBULATORY_CARE_PROVIDER_SITE_OTHER): Payer: BC Managed Care – PPO | Admitting: Family Medicine

## 2021-09-17 ENCOUNTER — Encounter: Payer: Self-pay | Admitting: Family Medicine

## 2021-09-17 ENCOUNTER — Ambulatory Visit (INDEPENDENT_AMBULATORY_CARE_PROVIDER_SITE_OTHER): Payer: BC Managed Care – PPO

## 2021-09-17 VITALS — BP 120/72 | HR 78 | Temp 97.4°F | Ht 72.0 in | Wt 263.0 lb

## 2021-09-17 DIAGNOSIS — E7800 Pure hypercholesterolemia, unspecified: Secondary | ICD-10-CM

## 2021-09-17 DIAGNOSIS — Z23 Encounter for immunization: Secondary | ICD-10-CM

## 2021-09-17 DIAGNOSIS — R972 Elevated prostate specific antigen [PSA]: Secondary | ICD-10-CM | POA: Diagnosis not present

## 2021-09-17 DIAGNOSIS — M25522 Pain in left elbow: Secondary | ICD-10-CM

## 2021-09-17 DIAGNOSIS — E119 Type 2 diabetes mellitus without complications: Secondary | ICD-10-CM

## 2021-09-17 DIAGNOSIS — I10 Essential (primary) hypertension: Secondary | ICD-10-CM | POA: Diagnosis not present

## 2021-09-17 LAB — BAYER DCA HB A1C WAIVED: HB A1C (BAYER DCA - WAIVED): 8.3 % — ABNORMAL HIGH (ref 4.8–5.6)

## 2021-09-17 MED ORDER — ROSUVASTATIN CALCIUM 20 MG PO TABS: 20.0000 mg | ORAL_TABLET | ORAL | 3 refills | Status: DC

## 2021-09-17 MED ORDER — METFORMIN HCL ER 500 MG PO TB24: 500.0000 mg | ORAL_TABLET | Freq: Every day | ORAL | 0 refills | Status: DC

## 2021-09-17 MED ORDER — ENALAPRIL MALEATE 10 MG PO TABS: 10.0000 mg | ORAL_TABLET | Freq: Every day | ORAL | 3 refills | Status: DC

## 2021-09-17 NOTE — Patient Instructions (Signed)
Fasting labs are in.  You may have done at your convenience  Flu shot given  We'll check your elbow out on xray and I'll run your case by the orthopedist. If he thinks you should be seen, I'll get you set up with him.

## 2021-09-17 NOTE — Progress Notes (Signed)
Subjective: CC:HTN, HLD PCP: Janora Norlander, DO QQP:YPPJK Jeffrey Barnett is a 61 y.o. male presenting to clinic today for:  1. HTN w/ HLD associated with morbid obesity He is compliant with his medications.  No chest pain, shortness of breath or edema reported.  He has gained some weight since her last visit but states that he easily has been able to lose weight in the past and will be changing his diet.  2.  Left elbow soreness Patient reports couple months history of a left-sided elbow soreness.  He reports difficulty fully extending his arm.  Denies any preceding injury.  He is right-hand dominant.  Does not recall any soft tissue swelling at the tip of the elbow.  He does have some soreness when he flexes his arm.  No sensory changes or weakness reported.  He continues to stay physically active   ROS: Per HPI  No Known Allergies Past Medical History:  Diagnosis Date   Fever blister    Hypertension    Other and unspecified hyperlipidemia    Villous adenoma    Wax in ear     Current Outpatient Medications:    Cholecalciferol (VITAMIN Jeffrey) 2000 UNITS CAPS, Take 1 capsule by mouth daily., Disp: , Rfl:    enalapril (VASOTEC) 10 MG tablet, Take 1 tablet (10 mg total) by mouth daily. (NEEDS TO BE SEEN BEFORE NEXT REFILL), Disp: 30 tablet, Rfl: 0   Omega-3 Fatty Acids (FISH OIL) 1000 MG CAPS, Take 2 capsules by mouth daily., Disp: , Rfl:    rosuvastatin (CRESTOR) 20 MG tablet, Take 1 tablet (20 mg total) by mouth every other day. (NEEDS TO BE SEEN BEFORE NEXT REFILL), Disp: 15 tablet, Rfl: 0   sildenafil (REVATIO) 20 MG tablet, Take 2-5 tablets as needed once daily for sexual activity, Disp: 100 tablet, Rfl: 0   valACYclovir (VALTREX) 1000 MG tablet, TAKE 1 TABLET BY MOUTH EVERY DAY AS DIRECTED, Disp: 90 tablet, Rfl: 0 Social History   Socioeconomic History   Marital status: Married    Spouse name: Not on file   Number of children: Not on file   Years of education: Not on file    Highest education level: Not on file  Occupational History   Not on file  Tobacco Use   Smoking status: Never   Smokeless tobacco: Never  Vaping Use   Vaping Use: Never used  Substance and Sexual Activity   Alcohol use: No   Drug use: No   Sexual activity: Not on file  Other Topics Concern   Not on file  Social History Narrative   Not on file   Social Determinants of Health   Financial Resource Strain: Not on file  Food Insecurity: Not on file  Transportation Needs: Not on file  Physical Activity: Not on file  Stress: Not on file  Social Connections: Not on file  Intimate Partner Violence: Not on file   Family History  Problem Relation Age of Onset   Dementia Mother    Thyroid disease Father     Objective: Office vital signs reviewed. BP 120/72    Pulse 78    Temp (!) 97.4 F (36.3 C) (Temporal)    Ht 6' (1.829 m)    Wt 263 lb (119.3 kg)    SpO2 97%    BMI 35.67 kg/m   Physical Examination:  General: Awake, alert, morbidly obese, No acute distress HEENT: Sclera white Cardio: regular rate and rhythm  Pulm: Normal work of  breathing on room air Extremities: warm, well perfused, No edema, cyanosis or clubbing; +2 pulses bilaterally MSK:   Left elbow: No appreciable soft tissue swelling or discoloration.  He has about a 10 degree loss in extension.  No appreciable Popeye deformity  Assessment/ Plan: 61 y.o. male   Left elbow pain - Plan: DG Elbow 2 Views Left  Essential hypertension - Plan: CMP14+EGFR, CMP14+EGFR, enalapril (VASOTEC) 10 MG tablet  Pure hypercholesterolemia - Plan: Lipid panel, CMP14+EGFR, TSH, Lipid panel, CMP14+EGFR, TSH, rosuvastatin (CRESTOR) 20 MG tablet  Morbid obesity (HCC) - Plan: Bayer DCA Hb A1c Waived, Bayer DCA Hb A1c Waived  Need for influenza vaccination  Elevated PSA - Plan: PSA, CBC, PSA, CBC  New onset type 2 diabetes mellitus (HCC) - Plan: metFORMIN (GLUCOPHAGE XR) 500 MG 24 hr tablet, Amb ref to Medical Nutrition  Therapy-MNT  Personal view of x-ray demonstrated no osteophytes or other loose bodies that would limit extension of that elbow.  I am waiting for radiology to formally review this.  I will see if Dr. Dallas Schimke might check him out during the next visit to the [facility] office.  For now, recommend oral analgesics  Blood pressure controlled.  Continue current regimen.  Refills have been sent.  Check renal function, liver enzymes and electrolytes  7-hour fast lab has been collected today.  Continue statin.  Counseled on weight loss today, carb counting.  Influenza vaccination administered  PSA collected today but not discussed during visit  A1c unfortunately consistent with a new onset type 2 diabetes at 8.1 today.  I counseled the patient on metformin start and possible side effects of medication.  I am referring him to nutrition and we will plan for diabetic foot exam and diabetic eye exam recommendations at next visit.  Could consider a GLP in this patient if he has trouble achieving weight loss goals.  Orders Placed This Encounter  Procedures   DG Elbow 2 Views Left    Standing Status:   Future    Number of Occurrences:   1    Standing Expiration Date:   09/17/2022    Order Specific Question:   Reason for Exam (SYMPTOM  OR DIAGNOSIS REQUIRED)    Answer:   reduced ROM and soreness. no injury    Order Specific Question:   Preferred imaging location?    Answer:   Internal   Lipid panel    Standing Status:   Future    Number of Occurrences:   1    Standing Expiration Date:   09/17/2022   CMP14+EGFR    Standing Status:   Future    Number of Occurrences:   1    Standing Expiration Date:   09/17/2022   PSA    Standing Status:   Future    Number of Occurrences:   1    Standing Expiration Date:   09/17/2022   TSH    Standing Status:   Future    Number of Occurrences:   1    Standing Expiration Date:   09/17/2022   CBC    Standing Status:   Future    Number of Occurrences:   1    Standing  Expiration Date:   09/17/2022   Bayer DCA Hb A1c Waived    Standing Status:   Future    Number of Occurrences:   1    Standing Expiration Date:   09/17/2022   No orders of the defined types were placed in this encounter.  Janora Norlander, DO [place] [phone]

## 2021-09-18 LAB — CBC
Hematocrit: 45.3 % (ref 37.5–51.0)
Hemoglobin: 15.6 g/dL (ref 13.0–17.7)
MCH: 29.3 pg (ref 26.6–33.0)
MCHC: 34.4 g/dL (ref 31.5–35.7)
MCV: 85 fL (ref 79–97)
Platelets: 253 10*3/uL (ref 150–450)
RBC: 5.33 x10E6/uL (ref 4.14–5.80)
RDW: 12.9 % (ref 11.6–15.4)
WBC: 7 10*3/uL (ref 3.4–10.8)

## 2021-09-18 LAB — LIPID PANEL
Chol/HDL Ratio: 3 ratio (ref 0.0–5.0)
Cholesterol, Total: 137 mg/dL (ref 100–199)
HDL: 46 mg/dL (ref >39)
LDL Chol Calc (NIH): 66 mg/dL (ref 0–99)
Triglycerides: 146 mg/dL (ref 0–149)
VLDL Cholesterol Cal: 25 mg/dL (ref 5–40)

## 2021-09-18 LAB — CMP14+EGFR
ALT: 45 IU/L — ABNORMAL HIGH (ref 0–44)
AST: 50 IU/L — ABNORMAL HIGH (ref 0–40)
Albumin/Globulin Ratio: 1.7 (ref 1.2–2.2)
Albumin: 4.7 g/dL (ref 3.8–4.8)
Alkaline Phosphatase: 98 IU/L (ref 44–121)
BUN/Creatinine Ratio: 13 (ref 10–24)
BUN: 11 mg/dL (ref 8–27)
Bilirubin Total: 0.5 mg/dL (ref 0.0–1.2)
CO2: 23 mmol/L (ref 20–29)
Calcium: 9.7 mg/dL (ref 8.6–10.2)
Chloride: 98 mmol/L (ref 96–106)
Creatinine, Ser: 0.88 mg/dL (ref 0.76–1.27)
Globulin, Total: 2.8 g/dL (ref 1.5–4.5)
Glucose: 104 mg/dL — ABNORMAL HIGH (ref 70–99)
Potassium: 4.8 mmol/L (ref 3.5–5.2)
Sodium: 137 mmol/L (ref 134–144)
Total Protein: 7.5 g/dL (ref 6.0–8.5)
eGFR: 97 mL/min/{1.73_m2} (ref >59)

## 2021-09-18 LAB — PSA: Prostate Specific Ag, Serum: 2.9 ng/mL (ref 0.0–4.0)

## 2021-09-18 LAB — TSH: TSH: 2.02 u[IU]/mL (ref 0.450–4.500)

## 2021-09-29 ENCOUNTER — Encounter: Payer: BC Managed Care – PPO | Attending: Family Medicine | Admitting: Registered"

## 2021-09-29 ENCOUNTER — Encounter: Payer: Self-pay | Admitting: Registered"

## 2021-09-29 ENCOUNTER — Other Ambulatory Visit: Payer: Self-pay

## 2021-09-29 DIAGNOSIS — E118 Type 2 diabetes mellitus with unspecified complications: Secondary | ICD-10-CM | POA: Diagnosis not present

## 2021-09-29 NOTE — Patient Instructions (Signed)
Exercise: Walking in the evenings  Checking blood sugar: 3 times per week  Diet: Cut back on portions, smaller plate Meal planning with wife Get better snacks to keep in the house  Support: Virtual support group [email or web address]

## 2021-09-29 NOTE — Progress Notes (Signed)
Diabetes Self-Management Education  Visit Type: First/Initial  Appt. Start Time: 1518 Appt. End Time: 2426  10/05/2021  [name], identified by name and date of birth, is a 61 y.o. male with a diagnosis of Diabetes: Type 2.   ASSESSMENT  Height 6' (1.829 m), weight 256 lb 1.6 oz (116.2 kg). Body mass index is 34.73 kg/m.  A1c 8.3% Medication: metformin 500 mg  Pt states he is interested in learning about the right foods to eat and how to read labels.  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    Diabetes Self-Management Education - 09/29/21 1519       Visit Information   Visit Type First/Initial      Initial Visit   Diabetes Type Type 2    Are you currently following a meal plan? No    Are you taking your medications as prescribed? Yes    Date Diagnosed Jan 2023      Health Coping   How would you rate your overall health? Good      Psychosocial Assessment   Patient Belief/Attitude about Diabetes Other (comment)   disappointed   How often do you need to have someone help you when you read instructions, pamphlets, or other written materials from your doctor or pharmacy? 1 - Never    What is the last grade level you completed in school? 12      Complications   Last HgB A1C per patient/outside source 8.3 %    How often do you check your blood sugar? 0 times/day (not testing)    Have you had a dilated eye exam in the past 12 months? No   2 years   Have you had a dental exam in the past 12 months? Yes    Are you checking your feet? No      Dietary Intake   Breakfast cereal, milk OR occasional eggs, breakfast meat, grits, toast with jelly    Snack (morning) none    Lunch vegetable plates OR barbeque, slaw, hush puppies OR burger    Snack (afternoon) none    Dinner chicken strips, salad, mashed potatoes OR pork chops (airfryer)    Snack (evening)  popcorn OR ice cream sometimes    Beverage(s) water, diet sodas, sweet tea regular or no sugar, coffee with 1/2&1/2 and sugar free sweetened in the morning, wine 1x/week, 1 beer 2x/month      Exercise   Exercise Type ADL's      Patient Education   Previous Diabetes Education No    Disease state  Definition of diabetes, type 1 and 2, and the diagnosis of diabetes    Nutrition management  Role of diet in the treatment of diabetes and the relationship between the three main macronutrients and blood glucose level;Food label reading, portion sizes and measuring food.;Meal options for control of blood glucose level and chronic complications.    Physical activity and exercise  Role of exercise on diabetes management, blood pressure control and cardiac health.    Medications Reviewed patients medication for diabetes, action, purpose, timing of dose and side effects.    Monitoring Identified appropriate SMBG and/or A1C goals.;Purpose and frequency of SMBG.    Chronic complications Relationship between chronic complications and blood glucose control;Retinopathy and reason for yearly dilated eye exams    Psychosocial adjustment Role of stress on diabetes;Helped patient  identify a support system for diabetes management      Individualized Goals (developed by patient)   Nutrition General guidelines for healthy choices and portions discussed    Physical Activity Exercise 5-7 days per week    Monitoring  test my blood glucose as discussed    Health Coping Other (comment)   support group     Outcomes   Expected Outcomes Demonstrated interest in learning. Expect positive outcomes    Future DMSE PRN    Program Status Completed             Individualized Plan for Diabetes Self-Management Training:   Learning Objective:  Patient will have a greater understanding of diabetes self-management. Patient education plan is to attend individual and/or group sessions per assessed needs and concerns.    Patient Instructions  Exercise: Walking in the evenings  Checking blood sugar: 3 times per week  Diet: Cut back on portions, smaller plate Meal planning with wife Get better snacks to keep in the house  Support: Virtual support group [email or web address]  Expected Outcomes:  Demonstrated interest in learning. Expect positive outcomes  Education material provided: ADA - How to Thrive: A Guide for Your Journey with Diabetes and My Plate  If problems or questions, patient to contact team via:  Phone and MyChart  Future DSME appointment: PRN

## 2021-10-02 ENCOUNTER — Ambulatory Visit: Payer: Self-pay

## 2021-10-05 DIAGNOSIS — E118 Type 2 diabetes mellitus with unspecified complications: Secondary | ICD-10-CM | POA: Insufficient documentation

## 2021-10-09 ENCOUNTER — Ambulatory Visit: Payer: Self-pay

## 2021-10-16 ENCOUNTER — Ambulatory Visit: Payer: Self-pay

## 2021-10-20 ENCOUNTER — Encounter: Payer: Self-pay | Admitting: Orthopedic Surgery

## 2021-10-20 ENCOUNTER — Ambulatory Visit: Payer: Self-pay | Admitting: Orthopedic Surgery

## 2021-10-20 ENCOUNTER — Other Ambulatory Visit: Payer: Self-pay

## 2021-10-20 ENCOUNTER — Ambulatory Visit (INDEPENDENT_AMBULATORY_CARE_PROVIDER_SITE_OTHER): Payer: BC Managed Care – PPO | Admitting: Orthopedic Surgery

## 2021-10-20 VITALS — BP 130/69 | HR 66 | Ht 72.0 in | Wt 251.0 lb

## 2021-10-20 DIAGNOSIS — M25522 Pain in left elbow: Secondary | ICD-10-CM | POA: Diagnosis not present

## 2021-10-20 NOTE — Progress Notes (Signed)
New Patient Visit  Assessment: Jeffrey Barnett is a 61 y.o. male with the following: 1. Pain in left elbow  Plan: Reports his pain has improved.  No specific injury to his left elbow.  Radiographs are negative.  It is difficult to discern what is causing his pain at this point.  No concern for biceps rupture.  He may have a component of some bicipital tendinitis, or possible triceps tendinitis.  He does have some tenderness over the lateral epicondyle.  He states that is improving, and so I think it is reasonable for him to continue without restrictions.  He can continue to work on his range of motion.  Medications as needed.  If the pain worsens, I recommended he return, we can consider further evaluation, which could include an injection.  All questions were answered, and he is amenable to this plan.   Follow-up: Return if symptoms worsen or fail to improve.  Subjective:  Chief Complaint  Patient presents with   Elbow Pain    L/elbow pain for about 2 months now. Can't straighten it at times and soreness.    History of Present Illness: Jeffrey Barnett is a 61 y.o. male who has been referred to clinic today by Ronnie Doss, DO for evaluation of left elbow pain.  Patient reports he has had pain in the left elbow for the past 2 months.  No specific injury.  There is been no swelling or bruising around the elbow.  He notes some progressive improvements recently.  His range of motion is significantly improved.  However, he states there have been times where he has had difficulty ranging the left elbow.  No history of an injury.   Review of Systems: No fevers or chills No numbness or tingling No chest pain No shortness of breath No bowel or bladder dysfunction No GI distress No headaches   Medical History:  Past Medical History:  Diagnosis Date   Fever blister    Hypertension    Other and unspecified hyperlipidemia    Villous adenoma    Wax in ear     Past Surgical History:   Procedure Laterality Date   APPENDECTOMY     SHOULDER SURGERY Left y-3    Family History  Problem Relation Age of Onset   Dementia Mother    Thyroid disease Father    Social History   Tobacco Use   Smoking status: Never   Smokeless tobacco: Never  Vaping Use   Vaping Use: Never used  Substance Use Topics   Alcohol use: No   Drug use: No    No Known Allergies  Current Meds  Medication Sig   Cholecalciferol (VITAMIN D) 2000 UNITS CAPS Take 1 capsule by mouth daily.   enalapril (VASOTEC) 10 MG tablet Take 1 tablet (10 mg total) by mouth daily.   metFORMIN (GLUCOPHAGE XR) 500 MG 24 hr tablet Take 1 tablet (500 mg total) by mouth daily with breakfast.   Omega-3 Fatty Acids (FISH OIL) 1000 MG CAPS Take 2 capsules by mouth daily.   rosuvastatin (CRESTOR) 20 MG tablet Take 1 tablet (20 mg total) by mouth every other day.   sildenafil (REVATIO) 20 MG tablet Take 2-5 tablets as needed once daily for sexual activity   valACYclovir (VALTREX) 1000 MG tablet TAKE 1 TABLET BY MOUTH EVERY DAY AS DIRECTED    Objective: BP 130/69    Pulse 66    Ht 6' (1.829 m)    Wt 251 lb (113.9 kg)  BMI 34.04 kg/m   Physical Exam:  General: Alert and oriented. and No acute distress. Gait: Normal gait.  Evaluation left elbow demonstrates no swelling.  No bruising is appreciated.  Range of motion from 10-120 degrees.  Full pronation and supination.  Pain with resisted long finger extension.  Pain over the lateral elbow with resisted wrist extension.  He also notes pain over the olecranon in this area.  No bruising or swelling over the olecranon.  Fingers are warm and well-perfused.  IMAGING: I personally reviewed images previously obtained in clinic  X-rays of the left elbow were previously obtained.  No acute injuries noted.  Well-maintained joint space.   New Medications:  No orders of the defined types were placed in this encounter.     Mordecai Rasmussen, MD  10/20/2021 9:54 PM

## 2021-11-05 ENCOUNTER — Ambulatory Visit: Payer: Self-pay | Admitting: Dietician

## 2021-11-12 ENCOUNTER — Other Ambulatory Visit: Payer: Self-pay

## 2021-11-12 ENCOUNTER — Encounter: Payer: Self-pay | Admitting: Registered"

## 2021-11-12 ENCOUNTER — Encounter: Payer: BC Managed Care – PPO | Attending: Family Medicine | Admitting: Registered"

## 2021-11-12 VITALS — Wt 245.7 lb

## 2021-11-12 DIAGNOSIS — E118 Type 2 diabetes mellitus with unspecified complications: Secondary | ICD-10-CM | POA: Diagnosis not present

## 2021-11-12 NOTE — Patient Instructions (Addendum)
Great job on the changes you've made especially on portions sizes and eating your dinner not too late in the evening. ?Continue reading labels. ?Continue checking blood sugar to get feedback on how your meals are affecting your blood sugar. ?Continue staying active, consider getting back into golf. ? ?

## 2021-11-12 NOTE — Progress Notes (Signed)
Diabetes Self-Management Education ? ?Visit Type: Follow-up ? ?Appt. Start Time: 1515 Appt. End Time: 1600 ? ?11/13/2021 ? ?[name], identified by name and date of birth, is a 61 y.o. male with a diagnosis of Diabetes: Type 2.  ? ?ASSESSMENT ? ?Weight 245 lb 11.2 oz (111.4 kg). ?Body mass index is 33.32 kg/m?. ? ?Changes since last visit: Patient states he attributes weight loss to smaller portions. He may get hungry now inbetween meals but will drink water or eat 100-calorie snacks. Pt states he reading labels and limiting added sugar and carbs. P reports he has cut back sausage, switch to sugar-free ice cream and smaller potions, still taking customers out for lunch but making healthier choices. ? ?Exercise: Pt states he has not done a lot of structured walking due to changes with his father-in law passing and mother-in-law moving Pt reports he does squats and sit-ups daily; dumb bells a few days per week. In the summer is very active outside with cows and taking care of home and property. Pt states he has new golf clubs and plans to start golfing more. ? ?SMBG:  ?FBS 110- 120 mg/dL Patient has been checking a few times per day, pre- and post-meals, with highest blood sugar recently 192 mg/dL at 1 hr after fried shrimp and baked potato. Most readings are well within what would be expected with A1c less than 7% ? Diabetes Self-Management Education - 11/12/21 1513   ? ?  ? Visit Information  ? Visit Type Follow-up   ?  ? Initial Visit  ? Diabetes Type Type 2   ?  ? Dietary Intake  ? Breakfast yogurt, granola OR boiled eggs   ? Snack (morning) almonds   ? Lunch [employer] [place], vegetables, baked chips   ? Dinner Information systems manager, chicken strips, green salad, steamed cabbage   ? Snack (evening) popcorn (smaller portions)   ? Beverage(s) water, coke zero 1/day,   ?  ? Exercise  ? Exercise Type Light (walking / raking leaves)   ? How many days per week to you exercise? 7   ? How many minutes per day do you exercise? 15    ? Total minutes per week of exercise 105   ?  ? Patient Education  ? Physical activity and exercise  Role of exercise on diabetes management, blood pressure control and cardiac health.   ? Monitoring Other (comment)   reason for timing of checking blood sugar  ?  ? Individualized Goals (developed by patient)  ? Nutrition General guidelines for healthy choices and portions discussed   ? Physical Activity Exercise 5-7 days per week   ? Monitoring  test my blood glucose as discussed   ?  ? Patient Self-Evaluation of Goals - Patient rates self as meeting previously set goals (% of time)  ? Nutrition >75%   ? Physical Activity 50 - 75 %   ? Monitoring >75%   ? Health Coping < 25%   ?  ? Outcomes  ? Expected Outcomes Demonstrated interest in learning. Expect positive outcomes   ? Future DMSE PRN   ? Program Status Completed   ?  ? Subsequent Visit  ? Since your last visit have you continued or begun to take your medications as prescribed? Yes   ? Since your last visit have you had your blood pressure checked? No   ? Since your last visit have you experienced any weight changes? Loss   ? Weight Loss (lbs) 9   ? ?  ?  ? ?  ? ?  Individualized Plan for Diabetes Self-Management Training:  ? ?Learning Objective:  Patient will have a greater understanding of diabetes self-management. ?Patient education plan is to attend individual and/or group sessions per assessed needs and concerns. ? ?Patient Instructions  ?Great job on the changes you've made especially on portions sizes and eating your dinner not too late in the evening. ?Continue reading labels. ?Continue checking blood sugar to get feedback on how your meals are affecting your blood sugar. ?Continue staying active, consider getting back into golf. ? ?Expected Outcomes:  Demonstrated interest in learning. Expect positive outcomes ? ?Education material provided: none ? ?If problems or questions, patient to contact team via:  Phone and MyChart ? ?Future DSME appointment:  PRN ?

## 2021-12-10 ENCOUNTER — Encounter: Payer: Self-pay | Admitting: Family Medicine

## 2021-12-10 ENCOUNTER — Ambulatory Visit (INDEPENDENT_AMBULATORY_CARE_PROVIDER_SITE_OTHER): Payer: BC Managed Care – PPO | Admitting: Family Medicine

## 2021-12-10 VITALS — BP 105/57 | HR 82 | Temp 98.4°F | Ht 72.0 in | Wt 237.4 lb

## 2021-12-10 DIAGNOSIS — E119 Type 2 diabetes mellitus without complications: Secondary | ICD-10-CM

## 2021-12-10 DIAGNOSIS — I152 Hypertension secondary to endocrine disorders: Secondary | ICD-10-CM | POA: Diagnosis not present

## 2021-12-10 DIAGNOSIS — E1159 Type 2 diabetes mellitus with other circulatory complications: Secondary | ICD-10-CM | POA: Diagnosis not present

## 2021-12-10 DIAGNOSIS — E1169 Type 2 diabetes mellitus with other specified complication: Secondary | ICD-10-CM

## 2021-12-10 DIAGNOSIS — E785 Hyperlipidemia, unspecified: Secondary | ICD-10-CM

## 2021-12-10 LAB — BAYER DCA HB A1C WAIVED: HB A1C (BAYER DCA - WAIVED): 5.6 % (ref 4.8–5.6)

## 2021-12-10 MED ORDER — ENALAPRIL MALEATE 10 MG PO TABS: 5.0000 mg | ORAL_TABLET | Freq: Every day | ORAL | 3 refills | Status: DC

## 2021-12-10 MED ORDER — METFORMIN HCL ER 500 MG PO TB24: 500.0000 mg | ORAL_TABLET | Freq: Every day | ORAL | 3 refills | Status: DC

## 2021-12-10 NOTE — Progress Notes (Signed)
? ?Subjective: ?CC:DM/ ?PCP: Janora Norlander, DO ?Jeffrey Barnett is a 61 y.o. male presenting to clinic today for: ? ?1. Type 2 Diabetes with hypertension, hyperlipidemia:  ?This is a new onset diagnosis.  He was seen about 2 and half months ago and A1c at that time was 8.3.  He had had an A1c roughly 6 months prior of 6.6 and this solidified diagnosis of diabetes.  He has since been very vigorous about staying physically active and reducing/eliminating unnecessary carbohydrates.  He has subsequently lost over 20 pounds.  He has seen dietitian x2.  He is compliant with the metformin that was prescribed and takes his statin and ACE inhibitor as well.  He feels better.  He plans on maintaining current dietary changes. ? ?Last eye exam: Needs to schedule ?Last foot exam: Needs ?Last A1c:  ?Lab Results  ?Component Value Date  ? HGBA1C 5.6 12/10/2021  ? ?Nephropathy screen indicated?:  On ACE inhibitor ?Last flu, zoster and/or pneumovax:  ?Immunization History  ?Administered Date(s) Administered  ? Influenza,inj,Quad PF,6+ Mos 08/08/2013, 09/13/2014, 09/01/2016, 07/29/2018, 10/02/2019, 06/24/2020, 09/17/2021  ? Influenza-Unspecified 07/10/2015  ? Moderna Sars-Covid-2 Vaccination 01/18/2020, 02/15/2020  ? Td 07/03/2008, 09/25/2019  ? Zoster Recombinat (Shingrix) 11/11/2018, 09/25/2019  ? ? ?ROS: Denies dizziness, LOC, polyuria, polydipsia, unintended weight loss/gain, foot ulcerations, numbness or tingling in extremities, shortness of breath or chest pain. ? ? ? ? ?ROS: Per HPI ? ?No Known Allergies ?Past Medical History:  ?Diagnosis Date  ? Fever blister   ? Hypertension   ? Other and unspecified hyperlipidemia   ? Villous adenoma   ? Wax in ear   ? ? ?Current Outpatient Medications:  ?  Cholecalciferol (VITAMIN D) 2000 UNITS CAPS, Take 1 capsule by mouth daily., Disp: , Rfl:  ?  enalapril (VASOTEC) 10 MG tablet, Take 1 tablet (10 mg total) by mouth daily., Disp: 90 tablet, Rfl: 3 ?  Omega-3 Fatty Acids (FISH  OIL) 1000 MG CAPS, Take 2 capsules by mouth daily., Disp: , Rfl:  ?  rosuvastatin (CRESTOR) 20 MG tablet, Take 1 tablet (20 mg total) by mouth every other day., Disp: 45 tablet, Rfl: 3 ?  sildenafil (REVATIO) 20 MG tablet, Take 2-5 tablets as needed once daily for sexual activity, Disp: 100 tablet, Rfl: 0 ?  valACYclovir (VALTREX) 1000 MG tablet, TAKE 1 TABLET BY MOUTH EVERY DAY AS DIRECTED, Disp: 90 tablet, Rfl: 0 ?  metFORMIN (GLUCOPHAGE XR) 500 MG 24 hr tablet, Take 1 tablet (500 mg total) by mouth daily with breakfast., Disp: 90 tablet, Rfl: 3 ?Social History  ? ?Socioeconomic History  ? Marital status: Married  ?  Spouse name: Not on file  ? Number of children: Not on file  ? Years of education: Not on file  ? Highest education level: Not on file  ?Occupational History  ? Not on file  ?Tobacco Use  ? Smoking status: Never  ? Smokeless tobacco: Never  ?Vaping Use  ? Vaping Use: Never used  ?Substance and Sexual Activity  ? Alcohol use: No  ? Drug use: No  ? Sexual activity: Not on file  ?Other Topics Concern  ? Not on file  ?Social History Narrative  ? Not on file  ? ?Social Determinants of Health  ? ?Financial Resource Strain: Not on file  ?Food Insecurity: No Food Insecurity  ? Worried About Charity fundraiser in the Last Year: Never true  ? Ran Out of Food in the Last Year: Never true  ?  Transportation Needs: Not on file  ?Physical Activity: Not on file  ?Stress: Not on file  ?Social Connections: Not on file  ?Intimate Partner Violence: Not on file  ? ?Family History  ?Problem Relation Age of Onset  ? Dementia Mother   ? Thyroid disease Father   ? ? ?Objective: ?Office vital signs reviewed. ?BP (!) 105/57   Pulse 82   Temp 98.4 ?F (36.9 ?C)   Ht 6' (1.829 m)   Wt 237 lb 6.4 oz (107.7 kg)   SpO2 97%   BMI 32.20 kg/m?  ? ?Physical Examination:  ?General: Awake, alert, well nourished, No acute distress ?HEENT: Sclera white.  Moist mucous membranes ?Cardio: regular rate and rhythm, S1S2 heard, no murmurs  appreciated ?Pulm: clear to auscultation bilaterally, no wheezes, rhonchi or rales; normal work of breathing on room air ?Extremities: warm, well perfused, No edema, cyanosis or clubbing; +2 pulses bilaterally ?Neuro: see DM ? ?Diabetic Foot Exam - Simple   ?Simple Foot Form ?Diabetic Foot exam was performed with the following findings: Yes 12/10/2021  4:36 PM  ?Visual Inspection ?No deformities, no ulcerations, no other skin breakdown bilaterally: Yes ?Sensation Testing ?Intact to touch and monofilament testing bilaterally: Yes ?Pulse Check ?Posterior Tibialis and Dorsalis pulse intact bilaterally: Yes ?Comments ?  ? ?Assessment/ Plan: ?61 y.o. male  ? ?New onset type 2 diabetes mellitus ([place]) - Plan: Bayer DCA Hb A1c Waived, metFORMIN (GLUCOPHAGE XR) 500 MG 24 hr tablet ? ?Hypertension associated with diabetes ([place]) - Plan: enalapril (VASOTEC) 10 MG tablet ? ?Hyperlipidemia associated with type 2 diabetes mellitus ([place]) ? ?Sugar now under excellent control with A1c down to 5.6.  He is made marked improvement in lifestyle modifications.  I am sure that this is much more reflective of his lifestyle modification that it is of the impact on the metformin but he wishes to continue and I think that is reasonable.  I have sent in a refill.  He will get diabetic eye exam done and have that sent here.  We did his diabetic foot exam and it was unremarkable. ? ?Blood pressure borderline low but well controlled.  Reduce enalapril to 5 mg daily.  Continue to monitor.  Currently not symptomatic ? ?Continue statin.  Not yet due for fasting lipid. ? ?We will reconvene in about 4 months, sooner if concerns arise.  If he maintains well controlled blood sugar we will plan to start spacing back out to 76-monthcheckups ? ?Orders Placed This Encounter  ?Procedures  ? Bayer DCA Hb A1c Waived  ? ?Meds ordered this encounter  ?Medications  ? metFORMIN (GLUCOPHAGE XR) 500 MG 24 hr tablet  ?  Sig: Take 1 tablet (500 mg total) by mouth daily  with breakfast.  ?  Dispense:  90 tablet  ?  Refill:  3  ? ? ? ?AJanora Norlander DO ?[place]?[phone] ? ?

## 2022-01-19 ENCOUNTER — Encounter: Payer: Self-pay | Admitting: Family Medicine

## 2022-01-19 ENCOUNTER — Ambulatory Visit: Payer: BC Managed Care – PPO | Admitting: Family Medicine

## 2022-01-19 VITALS — BP 117/67 | HR 78 | Ht 72.0 in | Wt 235.0 lb

## 2022-01-19 DIAGNOSIS — S46912A Strain of unspecified muscle, fascia and tendon at shoulder and upper arm level, left arm, initial encounter: Secondary | ICD-10-CM

## 2022-01-19 MED ORDER — CYCLOBENZAPRINE HCL 10 MG PO TABS: 10.0000 mg | ORAL_TABLET | Freq: Three times a day (TID) | ORAL | 0 refills | Status: DC | PRN

## 2022-01-19 NOTE — Progress Notes (Signed)
? ?BP 117/67   Pulse 78   Ht 6' (1.829 m)   Wt 235 lb (106.6 kg)   SpO2 99%   BMI 31.87 kg/m?   ? ?Subjective:  ? ?Patient ID: Jeffrey Barnett, male    DOB: 1960/10/31, 61 y.o.   MRN: [id number] ? ?HPI: ?Jeffrey Barnett is a 61 y.o. male presenting on 01/19/2022 for Shoulder Pain and Sore Throat (Left. Aggravated during exercise.) ? ? ?HPI ?Left scapula strain ?Patient has been working out a lot more and a lot of push-ups and other workouts and recently feels like since last Thursday about 5 days ago he has been having some more issues with that.  He says last night was really bad and that he could not sleep and started feeling this pain going down his left arm from that back scapula and then had to sleep sitting up so that his arms would be dependent and hanging down and not causing as much pain.  He denies having any chest pain or shortness of breath or chest tightness when he is working out or when he sitting.  He wants to make sure its not his heart but he definitely says that the pain happens more when he moves on that side or shoulder in certain positions and then he will have the pain shoot from his scapular region down that arm.  He has been taking some ibuprofen for it.  He denies any exertional chest pain or shortness of breath or chest pain or shortness of breath at rest. ? ?Relevant past medical, surgical, family and social history reviewed and updated as indicated. Interim medical history since our last visit reviewed. ?Allergies and medications reviewed and updated. ? ?Review of Systems  ?Constitutional:  Negative for chills and fever.  ?Eyes:  Negative for visual disturbance.  ?Respiratory:  Negative for chest tightness, shortness of breath and wheezing.   ?Cardiovascular:  Negative for chest pain, palpitations and leg swelling.  ?Musculoskeletal:  Positive for arthralgias and myalgias. Negative for back pain and gait problem.  ?Skin:  Negative for rash.  ?All other systems reviewed and are  negative. ? ?Per HPI unless specifically indicated above ? ? ?Allergies as of 01/19/2022   ?No Known Allergies ?  ? ?  ?Medication List  ?  ? ?  ? Accurate as of Jan 19, 2022  9:08 AM. If you have any questions, ask your nurse or doctor.  ?  ?  ? ?  ? ?cyclobenzaprine 10 MG tablet ?Commonly known as: FLEXERIL ?Take 1 tablet (10 mg total) by mouth 3 (three) times daily as needed for muscle spasms. ?Started by: Worthy Rancher, MD ?  ?enalapril 10 MG tablet ?Commonly known as: VASOTEC ?Take 0.5 tablets (5 mg total) by mouth daily. ?  ?Fish Oil 1000 MG Caps ?Take 2 capsules by mouth daily. ?  ?metFORMIN 500 MG 24 hr tablet ?Commonly known as: Glucophage XR ?Take 1 tablet (500 mg total) by mouth daily with breakfast. ?  ?rosuvastatin 20 MG tablet ?Commonly known as: CRESTOR ?Take 1 tablet (20 mg total) by mouth every other day. ?  ?sildenafil 20 MG tablet ?Commonly known as: REVATIO ?Take 2-5 tablets as needed once daily for sexual activity ?  ?valACYclovir 1000 MG tablet ?Commonly known as: VALTREX ?TAKE 1 TABLET BY MOUTH EVERY DAY AS DIRECTED ?  ?Vitamin D 50 MCG (2000 UT) Caps ?Take 1 capsule by mouth daily. ?  ? ?  ? ? ? ?Objective:  ? ?BP  117/67   Pulse 78   Ht 6' (1.829 m)   Wt 235 lb (106.6 kg)   SpO2 99%   BMI 31.87 kg/m?   ?Wt Readings from Last 3 Encounters:  ?01/19/22 235 lb (106.6 kg)  ?12/10/21 237 lb 6.4 oz (107.7 kg)  ?11/12/21 245 lb 11.2 oz (111.4 kg)  ?  ?Physical Exam ?Vitals and nursing note reviewed.  ?Constitutional:   ?   General: He is not in acute distress. ?   Appearance: He is well-developed. He is not diaphoretic.  ?Eyes:  ?   General: No scleral icterus. ?   Conjunctiva/sclera: Conjunctivae normal.  ?Neck:  ?   Thyroid: No thyromegaly.  ?Cardiovascular:  ?   Rate and Rhythm: Normal rate and regular rhythm.  ?   Heart sounds: Normal heart sounds. No murmur heard. ?Pulmonary:  ?   Effort: Pulmonary effort is normal. No respiratory distress.  ?   Breath sounds: Normal breath sounds. No  wheezing.  ?Musculoskeletal:     ?   General: Normal range of motion.  ?   Left shoulder: Normal. No swelling, deformity, tenderness or crepitus. Normal range of motion.  ?     Arms: ? ?   Cervical back: Neck supple.  ?Lymphadenopathy:  ?   Cervical: No cervical adenopathy.  ?Skin: ?   General: Skin is warm and dry.  ?   Findings: No rash.  ?Neurological:  ?   Mental Status: He is alert and oriented to person, place, and time.  ?   Coordination: Coordination normal.  ?Psychiatric:     ?   Behavior: Behavior normal.  ? ? ? ? ?Assessment & Plan:  ? ?Problem List Items Addressed This Visit   ?None ?Visit Diagnoses   ? ? Muscle strain of left scapular region, initial encounter    -  Primary  ? Relevant Medications  ? cyclobenzaprine (FLEXERIL) 10 MG tablet  ? ?  ?  ?Likely muscular strain, pain is reproducible upon palpation.  Give muscle laxer recommended ibuprofen and heating pad and TENS unit ?Follow up plan: ?Return if symptoms worsen or fail to improve. ? ?Counseling provided for all of the vaccine components ?No orders of the defined types were placed in this encounter. ? ? ?Caryl Pina, MD ?[place] ?01/19/2022, 9:08 AM ? ? ? ? ?

## 2022-01-22 LAB — HM DIABETES EYE EXAM

## 2022-01-29 DIAGNOSIS — M9902 Segmental and somatic dysfunction of thoracic region: Secondary | ICD-10-CM | POA: Diagnosis not present

## 2022-01-29 DIAGNOSIS — M5412 Radiculopathy, cervical region: Secondary | ICD-10-CM | POA: Diagnosis not present

## 2022-01-29 DIAGNOSIS — M9903 Segmental and somatic dysfunction of lumbar region: Secondary | ICD-10-CM | POA: Diagnosis not present

## 2022-01-29 DIAGNOSIS — M9901 Segmental and somatic dysfunction of cervical region: Secondary | ICD-10-CM | POA: Diagnosis not present

## 2022-02-03 DIAGNOSIS — M5412 Radiculopathy, cervical region: Secondary | ICD-10-CM | POA: Diagnosis not present

## 2022-02-03 DIAGNOSIS — M9902 Segmental and somatic dysfunction of thoracic region: Secondary | ICD-10-CM | POA: Diagnosis not present

## 2022-02-03 DIAGNOSIS — M9901 Segmental and somatic dysfunction of cervical region: Secondary | ICD-10-CM | POA: Diagnosis not present

## 2022-02-03 DIAGNOSIS — M9903 Segmental and somatic dysfunction of lumbar region: Secondary | ICD-10-CM | POA: Diagnosis not present

## 2022-02-04 DIAGNOSIS — M9901 Segmental and somatic dysfunction of cervical region: Secondary | ICD-10-CM | POA: Diagnosis not present

## 2022-02-04 DIAGNOSIS — M9903 Segmental and somatic dysfunction of lumbar region: Secondary | ICD-10-CM | POA: Diagnosis not present

## 2022-02-04 DIAGNOSIS — M5412 Radiculopathy, cervical region: Secondary | ICD-10-CM | POA: Diagnosis not present

## 2022-02-04 DIAGNOSIS — M9902 Segmental and somatic dysfunction of thoracic region: Secondary | ICD-10-CM | POA: Diagnosis not present

## 2022-02-05 DIAGNOSIS — M9901 Segmental and somatic dysfunction of cervical region: Secondary | ICD-10-CM | POA: Diagnosis not present

## 2022-02-05 DIAGNOSIS — M9902 Segmental and somatic dysfunction of thoracic region: Secondary | ICD-10-CM | POA: Diagnosis not present

## 2022-02-05 DIAGNOSIS — M5412 Radiculopathy, cervical region: Secondary | ICD-10-CM | POA: Diagnosis not present

## 2022-02-05 DIAGNOSIS — M9903 Segmental and somatic dysfunction of lumbar region: Secondary | ICD-10-CM | POA: Diagnosis not present

## 2022-02-09 DIAGNOSIS — M5412 Radiculopathy, cervical region: Secondary | ICD-10-CM | POA: Diagnosis not present

## 2022-02-09 DIAGNOSIS — M9901 Segmental and somatic dysfunction of cervical region: Secondary | ICD-10-CM | POA: Diagnosis not present

## 2022-02-09 DIAGNOSIS — M9903 Segmental and somatic dysfunction of lumbar region: Secondary | ICD-10-CM | POA: Diagnosis not present

## 2022-02-09 DIAGNOSIS — M9902 Segmental and somatic dysfunction of thoracic region: Secondary | ICD-10-CM | POA: Diagnosis not present

## 2022-02-10 DIAGNOSIS — M9902 Segmental and somatic dysfunction of thoracic region: Secondary | ICD-10-CM | POA: Diagnosis not present

## 2022-02-10 DIAGNOSIS — M9903 Segmental and somatic dysfunction of lumbar region: Secondary | ICD-10-CM | POA: Diagnosis not present

## 2022-02-10 DIAGNOSIS — M5412 Radiculopathy, cervical region: Secondary | ICD-10-CM | POA: Diagnosis not present

## 2022-02-10 DIAGNOSIS — M9901 Segmental and somatic dysfunction of cervical region: Secondary | ICD-10-CM | POA: Diagnosis not present

## 2022-02-12 DIAGNOSIS — M9901 Segmental and somatic dysfunction of cervical region: Secondary | ICD-10-CM | POA: Diagnosis not present

## 2022-02-12 DIAGNOSIS — M9902 Segmental and somatic dysfunction of thoracic region: Secondary | ICD-10-CM | POA: Diagnosis not present

## 2022-02-12 DIAGNOSIS — M5412 Radiculopathy, cervical region: Secondary | ICD-10-CM | POA: Diagnosis not present

## 2022-02-12 DIAGNOSIS — M9903 Segmental and somatic dysfunction of lumbar region: Secondary | ICD-10-CM | POA: Diagnosis not present

## 2022-02-16 DIAGNOSIS — M9903 Segmental and somatic dysfunction of lumbar region: Secondary | ICD-10-CM | POA: Diagnosis not present

## 2022-02-16 DIAGNOSIS — M9902 Segmental and somatic dysfunction of thoracic region: Secondary | ICD-10-CM | POA: Diagnosis not present

## 2022-02-16 DIAGNOSIS — M9901 Segmental and somatic dysfunction of cervical region: Secondary | ICD-10-CM | POA: Diagnosis not present

## 2022-02-16 DIAGNOSIS — M5412 Radiculopathy, cervical region: Secondary | ICD-10-CM | POA: Diagnosis not present

## 2022-02-17 DIAGNOSIS — M9903 Segmental and somatic dysfunction of lumbar region: Secondary | ICD-10-CM | POA: Diagnosis not present

## 2022-02-17 DIAGNOSIS — M9901 Segmental and somatic dysfunction of cervical region: Secondary | ICD-10-CM | POA: Diagnosis not present

## 2022-02-17 DIAGNOSIS — M9902 Segmental and somatic dysfunction of thoracic region: Secondary | ICD-10-CM | POA: Diagnosis not present

## 2022-02-17 DIAGNOSIS — M5412 Radiculopathy, cervical region: Secondary | ICD-10-CM | POA: Diagnosis not present

## 2022-02-26 ENCOUNTER — Encounter: Payer: Self-pay | Admitting: Family Medicine

## 2022-02-27 ENCOUNTER — Ambulatory Visit (INDEPENDENT_AMBULATORY_CARE_PROVIDER_SITE_OTHER): Payer: BC Managed Care – PPO

## 2022-02-27 ENCOUNTER — Ambulatory Visit (INDEPENDENT_AMBULATORY_CARE_PROVIDER_SITE_OTHER): Payer: BC Managed Care – PPO | Admitting: Nurse Practitioner

## 2022-02-27 ENCOUNTER — Encounter: Payer: Self-pay | Admitting: Nurse Practitioner

## 2022-02-27 VITALS — BP 126/82 | HR 91 | Ht 73.0 in | Wt 227.8 lb

## 2022-02-27 DIAGNOSIS — M25512 Pain in left shoulder: Secondary | ICD-10-CM

## 2022-02-27 DIAGNOSIS — M19012 Primary osteoarthritis, left shoulder: Secondary | ICD-10-CM | POA: Diagnosis not present

## 2022-02-27 MED ORDER — METHOCARBAMOL 500 MG PO TABS: 500.0000 mg | ORAL_TABLET | Freq: Four times a day (QID) | ORAL | 1 refills | Status: DC

## 2022-02-27 MED ORDER — NAPROXEN 500 MG PO TABS: 500.0000 mg | ORAL_TABLET | Freq: Two times a day (BID) | ORAL | 0 refills | Status: DC

## 2022-04-10 ENCOUNTER — Encounter: Payer: Self-pay | Admitting: Family Medicine

## 2022-04-10 ENCOUNTER — Ambulatory Visit (INDEPENDENT_AMBULATORY_CARE_PROVIDER_SITE_OTHER): Payer: BC Managed Care – PPO | Admitting: Family Medicine

## 2022-04-10 VITALS — BP 114/66 | HR 76 | Temp 98.3°F | Ht 73.0 in | Wt 231.4 lb

## 2022-04-10 DIAGNOSIS — E1169 Type 2 diabetes mellitus with other specified complication: Secondary | ICD-10-CM

## 2022-04-10 DIAGNOSIS — E785 Hyperlipidemia, unspecified: Secondary | ICD-10-CM

## 2022-04-10 DIAGNOSIS — I152 Hypertension secondary to endocrine disorders: Secondary | ICD-10-CM

## 2022-04-10 DIAGNOSIS — E1159 Type 2 diabetes mellitus with other circulatory complications: Secondary | ICD-10-CM

## 2022-04-10 DIAGNOSIS — E119 Type 2 diabetes mellitus without complications: Secondary | ICD-10-CM | POA: Diagnosis not present

## 2022-04-10 DIAGNOSIS — N529 Male erectile dysfunction, unspecified: Secondary | ICD-10-CM

## 2022-04-10 DIAGNOSIS — E118 Type 2 diabetes mellitus with unspecified complications: Secondary | ICD-10-CM

## 2022-04-10 LAB — BAYER DCA HB A1C WAIVED: HB A1C (BAYER DCA - WAIVED): 6.1 % — ABNORMAL HIGH (ref 4.8–5.6)

## 2022-04-10 MED ORDER — SILDENAFIL CITRATE 20 MG PO TABS: ORAL_TABLET | ORAL | 0 refills | Status: DC

## 2022-04-10 NOTE — Progress Notes (Signed)
Subjective: CC:DM PCP: Janora Norlander, DO YPP:JKDTO Jeffrey Barnett is a 61 y.o. male presenting to clinic today for:  1. Type 2 Diabetes with hypertension, hyperlipidemia:  Has been compliant with metformin extended release 500 mg daily up until about 2 weeks ago.  He never got his refill.  He continues to work on dietary restriction, physical activity.  He is compliant with 5 mg of Vasotec daily, Crestor 20 mg q. OD.  Last eye exam: UTD Last foot exam: UTD Last A1c:  Lab Results  Component Value Date   HGBA1C 5.6 12/10/2021   Nephropathy screen indicated?: on acei Last flu, zoster and/or pneumovax:  Immunization History  Administered Date(s) Administered   Influenza,inj,Quad PF,6+ Mos 08/08/2013, 09/13/2014, 09/01/2016, 07/29/2018, 10/02/2019, 06/24/2020, 09/17/2021   Influenza-Unspecified 07/10/2015   Moderna Sars-Covid-2 Vaccination 01/18/2020, 02/15/2020   Td 07/03/2008, 09/25/2019   Zoster Recombinat (Shingrix) 11/11/2018, 09/25/2019    ROS: no dizziness, LOC, polyuria, polydipsia, unintended weight loss/gain, foot ulcerations, numbness or tingling in extremities, shortness of breath or chest pain.     ROS: Per HPI  No Known Allergies Past Medical History:  Diagnosis Date   Fever blister    Hypertension    Other and unspecified hyperlipidemia    Villous adenoma    Wax in ear     Current Outpatient Medications:    Cholecalciferol (VITAMIN Jeffrey) 2000 UNITS CAPS, Take 1 capsule by mouth daily., Disp: , Rfl:    enalapril (VASOTEC) 10 MG tablet, Take 0.5 tablets (5 mg total) by mouth daily., Disp: 45 tablet, Rfl: 3   metFORMIN (GLUCOPHAGE XR) 500 MG 24 hr tablet, Take 1 tablet (500 mg total) by mouth daily with breakfast., Disp: 90 tablet, Rfl: 3   methocarbamol (ROBAXIN) 500 MG tablet, Take 1 tablet (500 mg total) by mouth 4 (four) times daily., Disp: 60 tablet, Rfl: 1   naproxen (NAPROSYN) 500 MG tablet, Take 1 tablet (500 mg total) by mouth 2 (two) times daily with  a meal., Disp: 30 tablet, Rfl: 0   Omega-3 Fatty Acids (FISH OIL) 1000 MG CAPS, Take 2 capsules by mouth daily., Disp: , Rfl:    rosuvastatin (CRESTOR) 20 MG tablet, Take 1 tablet (20 mg total) by mouth every other day., Disp: 45 tablet, Rfl: 3   sildenafil (REVATIO) 20 MG tablet, Take 2-5 tablets as needed once daily for sexual activity, Disp: 100 tablet, Rfl: 0   valACYclovir (VALTREX) 1000 MG tablet, TAKE 1 TABLET BY MOUTH EVERY DAY AS DIRECTED, Disp: 90 tablet, Rfl: 0 Social History   Socioeconomic History   Marital status: Married    Spouse name: Not on file   Number of children: Not on file   Years of education: Not on file   Highest education level: Not on file  Occupational History   Not on file  Tobacco Use   Smoking status: Never   Smokeless tobacco: Never  Vaping Use   Vaping Use: Never used  Substance and Sexual Activity   Alcohol use: No   Drug use: No   Sexual activity: Not on file  Other Topics Concern   Not on file  Social History Narrative   Not on file   Social Determinants of Health   Financial Resource Strain: Not on file  Food Insecurity: No Food Insecurity (09/29/2021)   Hunger Vital Sign    Worried About Running Out of Food in the Last Year: Never true    Ran Out of Food in the Last Year: Never true  Transportation Needs: Not on file  Physical Activity: Not on file  Stress: Not on file  Social Connections: Not on file  Intimate Partner Violence: Not on file   Family History  Problem Relation Age of Onset   Dementia Mother    Thyroid disease Father     Objective: Office vital signs reviewed. BP 114/66   Pulse 76   Temp 98.3 F (36.8 C)   Ht '6\' 1"'$  (1.854 m)   Wt 231 lb 6.4 oz (105 kg)   SpO2 98%   BMI 30.53 kg/m   Physical Examination:  General: Awake, alert, well nourished, No acute distress HEENT: Sclera white.  Moist mucous membranes Cardio: regular rate and rhythm, S1S2 heard, no murmurs appreciated Pulm: clear to auscultation  bilaterally, no wheezes, rhonchi or rales; normal work of breathing on room air   Assessment/ Plan: 61 y.o. male   Controlled type 2 diabetes mellitus with complication, without long-term current use of insulin ([place]) - Plan: Bayer DCA Hb A1c Waived  Hypertension associated with diabetes ([place])  Hyperlipidemia associated with type 2 diabetes mellitus ([place])  Erectile dysfunction, unspecified erectile dysfunction type - Plan: sildenafil (REVATIO) 20 MG tablet  Sugar under excellent control with A1c of 6.1.  Okay to stay off the metformin to see how things go elevated since he has continue to work on lifestyle modification.  We will reassess in 3 months and if A1c remains below 7, no need to resume use of metformin  Blood pressure is controlled.  No changes  Continue statin every other day given diabetes  Sildenafil was renewed.  His symptoms are stable with as needed use    Orders Placed This Encounter  Procedures   Bayer DCA Hb A1c Waived   No orders of the defined types were placed in this encounter.    Janora Norlander, DO [place] [phone]

## 2022-05-12 ENCOUNTER — Other Ambulatory Visit: Payer: Self-pay | Admitting: Family Medicine

## 2022-05-12 DIAGNOSIS — N529 Male erectile dysfunction, unspecified: Secondary | ICD-10-CM

## 2022-05-12 NOTE — Telephone Encounter (Signed)
Please check with patient that he is actually requesting this.  This looks like an autofill request

## 2022-05-12 NOTE — Telephone Encounter (Signed)
Last office visit 04/10/22 Last refill 04/10/22, #100, no refills

## 2022-07-02 IMAGING — DX DG ELBOW 2V*L*
2 series · 2 of 2 positions shown · non-contrast
Comparison: None.

CLINICAL DATA: Pain

EXAM:
LEFT ELBOW - 2 VIEW

[elbow ap]
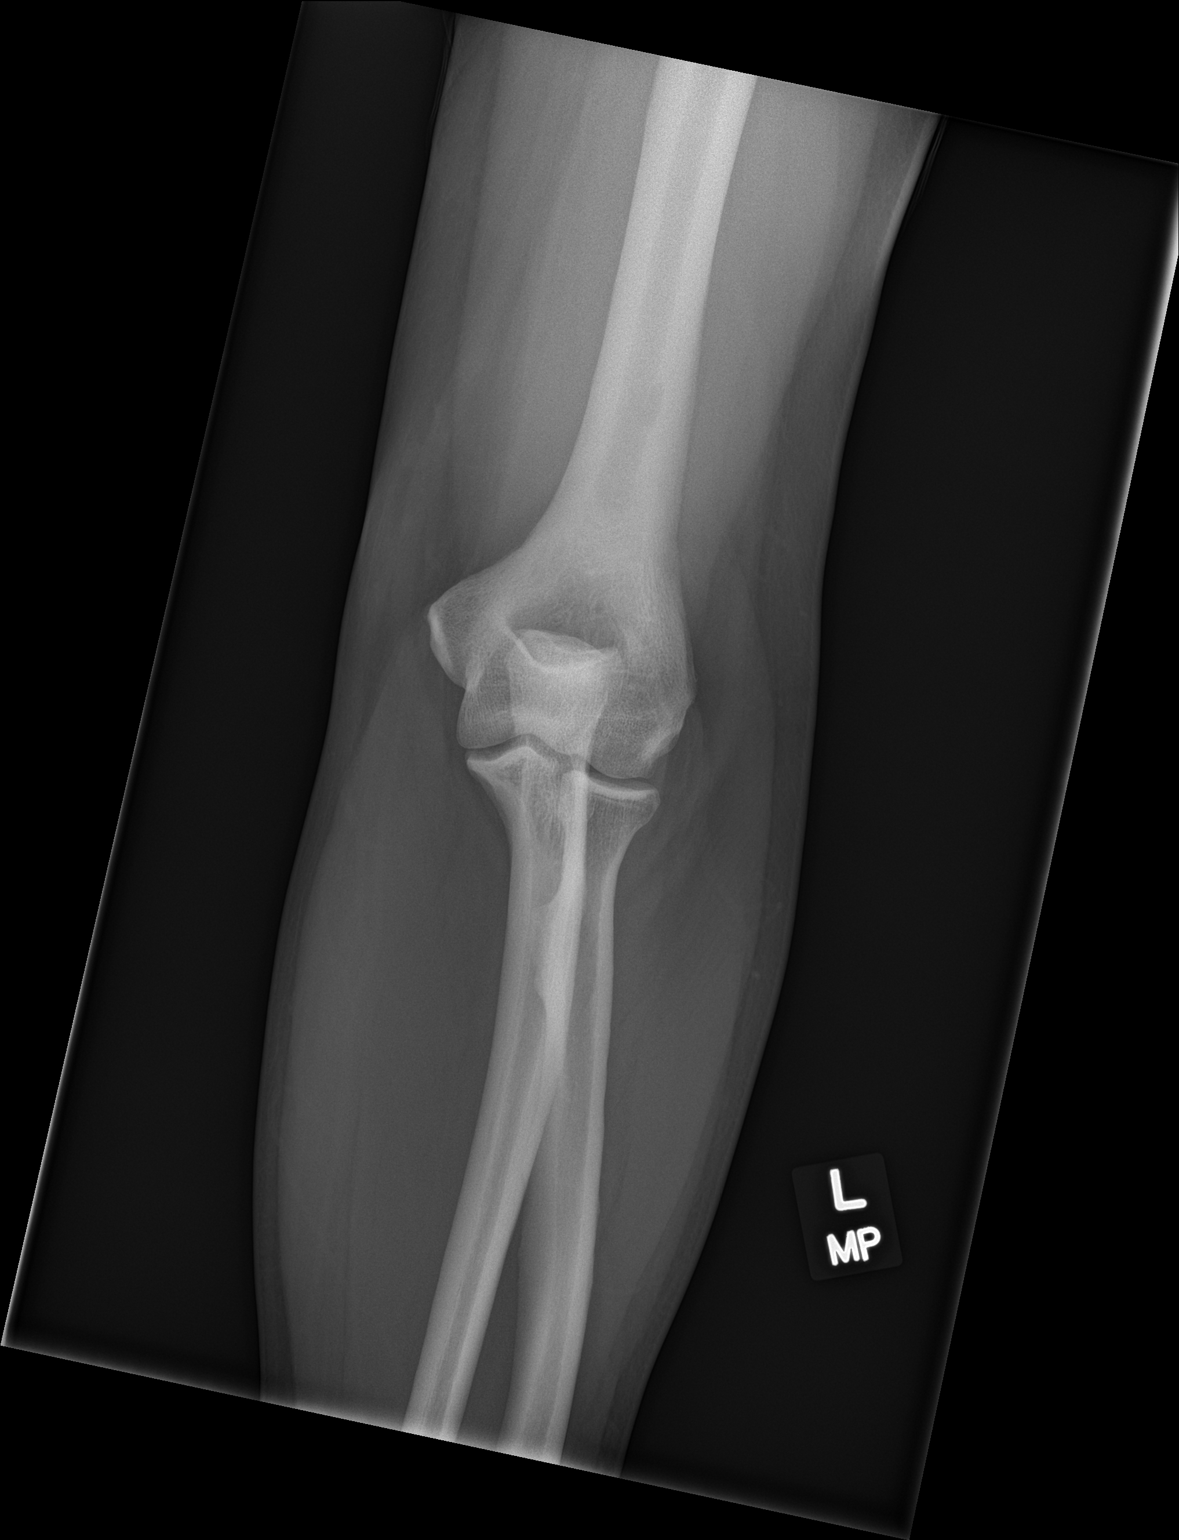

[elbow lat]
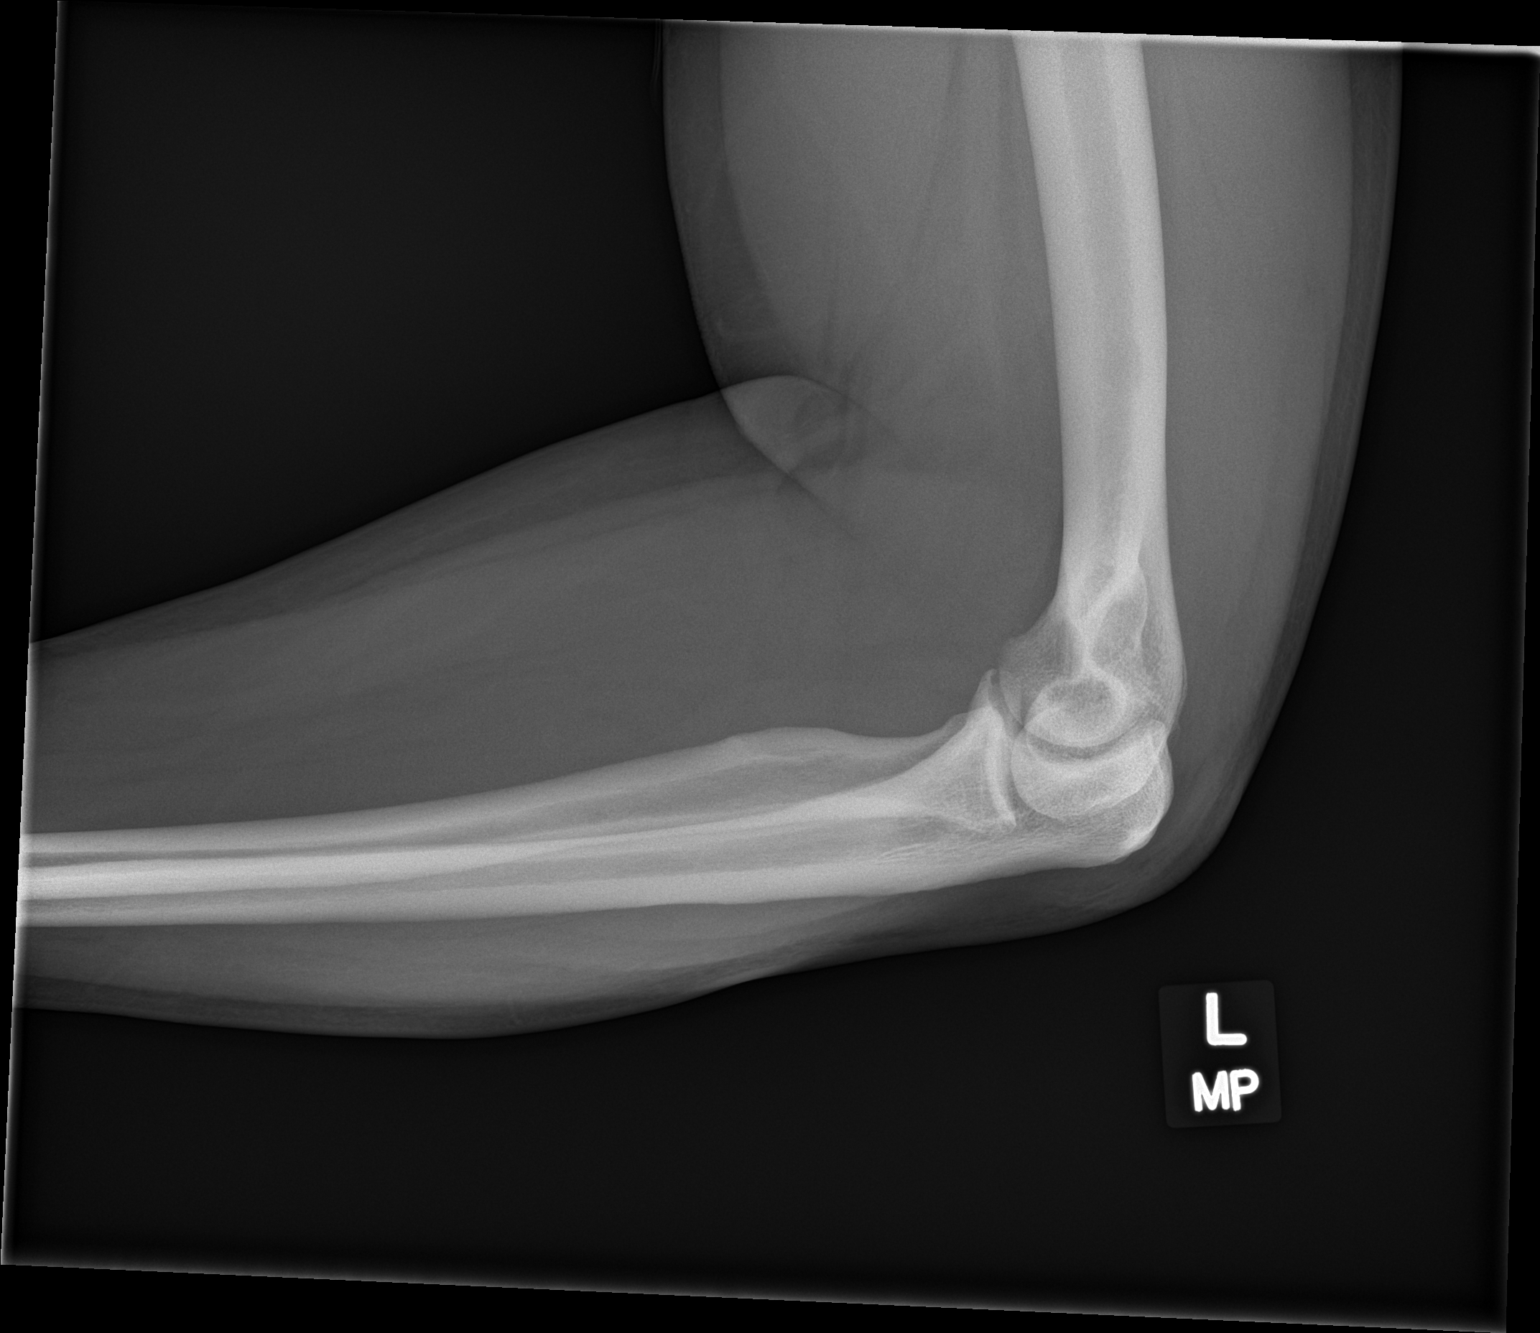

[2 of 2 positions shown; findings below may reference images not displayed]

FINDINGS: There is no evidence of fracture, dislocation, or joint effusion.
There is no evidence of arthropathy or other focal bone abnormality.
Soft tissues are unremarkable.
IMPRESSION: No radiographic abnormality is seen in the left elbow.

## 2022-07-10 ENCOUNTER — Encounter: Payer: Self-pay | Admitting: Family Medicine

## 2022-07-10 ENCOUNTER — Ambulatory Visit: Payer: BC Managed Care – PPO | Admitting: Family Medicine

## 2022-07-10 VITALS — BP 121/72 | HR 81 | Temp 98.3°F | Ht 73.0 in | Wt 239.8 lb

## 2022-07-10 DIAGNOSIS — I152 Hypertension secondary to endocrine disorders: Secondary | ICD-10-CM

## 2022-07-10 DIAGNOSIS — Z23 Encounter for immunization: Secondary | ICD-10-CM | POA: Diagnosis not present

## 2022-07-10 DIAGNOSIS — E118 Type 2 diabetes mellitus with unspecified complications: Secondary | ICD-10-CM

## 2022-07-10 DIAGNOSIS — E1159 Type 2 diabetes mellitus with other circulatory complications: Secondary | ICD-10-CM | POA: Diagnosis not present

## 2022-07-10 DIAGNOSIS — E1169 Type 2 diabetes mellitus with other specified complication: Secondary | ICD-10-CM | POA: Diagnosis not present

## 2022-07-10 DIAGNOSIS — E785 Hyperlipidemia, unspecified: Secondary | ICD-10-CM

## 2022-07-10 LAB — BAYER DCA HB A1C WAIVED: HB A1C (BAYER DCA - WAIVED): 6.2 % — ABNORMAL HIGH (ref 4.8–5.6)

## 2022-07-10 NOTE — Progress Notes (Signed)
Subjective: CC:DM PCP: Janora Norlander, DO SHF:WYOVZ D Colleran is a 61 y.o. male presenting to clinic today for:  1. Type 2 Diabetes with hypertension, hyperlipidemia:  At last visit, we discontinued his metformin in efforts to see if we can may be get him off of the medication totally.  He has had a couple of vacations where he was not as restrictive during those times with his diet but he has resumed back to his normal regimen.  He unfortunately has gained a few pounds due to the changes in eating but again he has gotten back into his exercise and low-carb dieting.  No polydipsia, polyuria, visual disturbance.  No chest pain or shortness of breath.  Compliant with statin and blood pressure medications.  Last eye exam: UTD Last foot exam: UTD Last A1c:  Lab Results  Component Value Date   HGBA1C 6.1 (H) 04/10/2022   Nephropathy screen indicated?: needs Last flu, zoster and/or pneumovax:  Immunization History  Administered Date(s) Administered   Influenza,inj,Quad PF,6+ Mos 08/08/2013, 09/13/2014, 09/01/2016, 07/29/2018, 10/02/2019, 06/24/2020, 09/17/2021   Influenza-Unspecified 07/10/2015   Moderna Sars-Covid-2 Vaccination 01/18/2020, 02/15/2020   Td 07/03/2008, 09/25/2019   Zoster Recombinat (Shingrix) 11/11/2018, 09/25/2019   ROS: Per HPI  No Known Allergies Past Medical History:  Diagnosis Date   Fever blister    Hypertension    Other and unspecified hyperlipidemia    Villous adenoma    Wax in ear     Current Outpatient Medications:    Cholecalciferol (VITAMIN D) 2000 UNITS CAPS, Take 1 capsule by mouth daily., Disp: , Rfl:    enalapril (VASOTEC) 10 MG tablet, Take 0.5 tablets (5 mg total) by mouth daily., Disp: 45 tablet, Rfl: 3   methocarbamol (ROBAXIN) 500 MG tablet, Take 1 tablet (500 mg total) by mouth 4 (four) times daily., Disp: 60 tablet, Rfl: 1   Omega-3 Fatty Acids (FISH OIL) 1000 MG CAPS, Take 2 capsules by mouth daily., Disp: , Rfl:    rosuvastatin  (CRESTOR) 20 MG tablet, Take 1 tablet (20 mg total) by mouth every other day., Disp: 45 tablet, Rfl: 3   sildenafil (REVATIO) 20 MG tablet, Take 2-5 tablets as needed once daily for sexual activity, Disp: 100 tablet, Rfl: 0   valACYclovir (VALTREX) 1000 MG tablet, TAKE 1 TABLET BY MOUTH EVERY DAY AS DIRECTED, Disp: 90 tablet, Rfl: 0   metFORMIN (GLUCOPHAGE XR) 500 MG 24 hr tablet, Take 1 tablet (500 mg total) by mouth daily with breakfast. (Patient not taking: Reported on 07/10/2022), Disp: 90 tablet, Rfl: 3 Social History   Socioeconomic History   Marital status: Married    Spouse name: Not on file   Number of children: Not on file   Years of education: Not on file   Highest education level: Not on file  Occupational History   Not on file  Tobacco Use   Smoking status: Never   Smokeless tobacco: Never  Vaping Use   Vaping Use: Never used  Substance and Sexual Activity   Alcohol use: No   Drug use: No   Sexual activity: Not on file  Other Topics Concern   Not on file  Social History Narrative   Not on file   Social Determinants of Health   Financial Resource Strain: Not on file  Food Insecurity: No Food Insecurity (09/29/2021)   Hunger Vital Sign    Worried About Running Out of Food in the Last Year: Never true    Ran Out of Food in  the Last Year: Never true  Transportation Needs: Not on file  Physical Activity: Not on file  Stress: Not on file  Social Connections: Not on file  Intimate Partner Violence: Not on file   Family History  Problem Relation Age of Onset   Dementia Mother    Thyroid disease Father     Objective: Office vital signs reviewed. BP 121/72   Pulse 81   Temp 98.3 F (36.8 C)   Ht '6\' 1"'$  (1.854 m)   Wt 239 lb 12.8 oz (108.8 kg)   SpO2 98%   BMI 31.64 kg/m   Physical Examination:  General: Awake, alert, well nourished, No acute distress HEENT: Sclera white.  Moist mucous membranes Cardio: regular rate and rhythm, S1S2 heard, no murmurs  appreciated Pulm: clear to auscultation bilaterally, no wheezes, rhonchi or rales; normal work of breathing on room air    Assessment/ Plan: 62 y.o. male   Controlled type 2 diabetes mellitus with complication, without long-term current use of insulin ([place]) - Plan: Microalbumin / creatinine urine ratio, Bayer DCA Hb A1c Waived, Hepatic Function Panel  Hypertension associated with diabetes ([place])  Hyperlipidemia associated with type 2 diabetes mellitus ([place])  Need for immunization against influenza - Plan: Flu Vaccine QUAD 12moIM (Fluarix, Fluzone & Alfiuria Quad PF)  A1c shows great control at 6.2.  No changes needed.  No need to resume use of metformin.  We will get eye appointment done at my eye doctor soon  Blood pressure well controlled.  No changes  Continue statin.  Not yet due for fasting lipid.  Plan for this at his physical  Influenza vaccination administered  Orders Placed This Encounter  Procedures   Microalbumin / creatinine urine ratio   Bayer DCA Hb A1c Waived   No orders of the defined types were placed in this encounter.    AJanora Norlander DO [place]([phone]

## 2022-07-10 NOTE — Addendum Note (Signed)
Addended by: Janora Norlander on: 07/10/2022 05:05 PM   Modules accepted: Orders

## 2022-07-11 LAB — MICROALBUMIN / CREATININE URINE RATIO
Creatinine, Urine: 51.3 mg/dL
Microalb/Creat Ratio: <6 mg/g creat (ref 0–29)
Microalbumin, Urine: <3 ug/mL

## 2022-07-11 LAB — HEPATIC FUNCTION PANEL
ALT: 11 IU/L (ref 0–44)
AST: 16 IU/L (ref 0–40)
Albumin: 4.6 g/dL (ref 3.9–4.9)
Alkaline Phosphatase: 65 IU/L (ref 44–121)
Bilirubin Total: 0.4 mg/dL (ref 0.0–1.2)
Bilirubin, Direct: 0.17 mg/dL (ref 0.00–0.40)
Total Protein: 6.8 g/dL (ref 6.0–8.5)

## 2022-11-05 ENCOUNTER — Encounter: Payer: Self-pay | Admitting: Radiology

## 2022-12-01 ENCOUNTER — Other Ambulatory Visit: Payer: Self-pay | Admitting: Family Medicine

## 2022-12-01 DIAGNOSIS — E7800 Pure hypercholesterolemia, unspecified: Secondary | ICD-10-CM

## 2023-01-08 ENCOUNTER — Encounter: Payer: Self-pay | Admitting: Family Medicine

## 2023-01-08 ENCOUNTER — Ambulatory Visit (INDEPENDENT_AMBULATORY_CARE_PROVIDER_SITE_OTHER): Payer: BC Managed Care – PPO | Admitting: Family Medicine

## 2023-01-08 VITALS — BP 125/80 | HR 92 | Temp 98.6°F | Ht 73.0 in | Wt 245.0 lb

## 2023-01-08 DIAGNOSIS — E119 Type 2 diabetes mellitus without complications: Secondary | ICD-10-CM | POA: Diagnosis not present

## 2023-01-08 DIAGNOSIS — Z6832 Body mass index (BMI) 32.0-32.9, adult: Secondary | ICD-10-CM

## 2023-01-08 DIAGNOSIS — E1169 Type 2 diabetes mellitus with other specified complication: Secondary | ICD-10-CM | POA: Diagnosis not present

## 2023-01-08 DIAGNOSIS — R972 Elevated prostate specific antigen [PSA]: Secondary | ICD-10-CM

## 2023-01-08 DIAGNOSIS — I152 Hypertension secondary to endocrine disorders: Secondary | ICD-10-CM

## 2023-01-08 DIAGNOSIS — Z125 Encounter for screening for malignant neoplasm of prostate: Secondary | ICD-10-CM | POA: Diagnosis not present

## 2023-01-08 DIAGNOSIS — Z0000 Encounter for general adult medical examination without abnormal findings: Secondary | ICD-10-CM

## 2023-01-08 DIAGNOSIS — E785 Hyperlipidemia, unspecified: Secondary | ICD-10-CM

## 2023-01-08 DIAGNOSIS — Z0001 Encounter for general adult medical examination with abnormal findings: Secondary | ICD-10-CM

## 2023-01-08 DIAGNOSIS — E1159 Type 2 diabetes mellitus with other circulatory complications: Secondary | ICD-10-CM | POA: Diagnosis not present

## 2023-01-08 DIAGNOSIS — E559 Vitamin D deficiency, unspecified: Secondary | ICD-10-CM

## 2023-01-08 DIAGNOSIS — E118 Type 2 diabetes mellitus with unspecified complications: Secondary | ICD-10-CM

## 2023-01-08 LAB — CBC WITH DIFFERENTIAL/PLATELET
Basos: 1 %
Immature Granulocytes: 0 %
Lymphs: 24 %
Monocytes Absolute: 0.6 10*3/uL (ref 0.1–0.9)
Monocytes: 9 %
Neutrophils Absolute: 4.3 10*3/uL (ref 1.4–7.0)
Neutrophils: 65 %
RDW: 12.9 % (ref 11.6–15.4)

## 2023-01-08 LAB — TSH

## 2023-01-08 LAB — PSA

## 2023-01-08 LAB — LIPID PANEL
LDL Chol Calc (NIH): 56 mg/dL (ref 0–99)
VLDL Cholesterol Cal: 17 mg/dL (ref 5–40)

## 2023-01-08 LAB — BAYER DCA HB A1C WAIVED: HB A1C (BAYER DCA - WAIVED): 6.6 % — ABNORMAL HIGH (ref 4.8–5.6)

## 2023-01-08 LAB — CMP14+EGFR
BUN/Creatinine Ratio: 15 (ref 10–24)
Creatinine, Ser: 1.1 mg/dL (ref 0.76–1.27)

## 2023-01-08 NOTE — Patient Instructions (Signed)
Sugar still controlled but up some.  A1c 6.6 today  Understanding your Hemoglobin A1c:     Diabetes Mellitus and Nutrition When you have diabetes (diabetes mellitus), it is very important to have healthy eating habits because your blood sugar (glucose) levels are greatly affected by what you eat and drink. Eating healthy foods in the appropriate amounts, at about the same times every day, can help you: Control your blood glucose. Lower your risk of heart disease. Improve your blood pressure. Reach or maintain a healthy weight.  Every person with diabetes is different, and each person has different needs for a meal plan. Your health care provider may recommend that you work with a diet and nutrition specialist (dietitian) to make a meal plan that is best for you. Your meal plan may vary depending on factors such as: The calories you need. The medicines you take. Your weight. Your blood glucose, blood pressure, and cholesterol levels. Your activity level. Other health conditions you have, such as heart or kidney disease.  How do carbohydrates affect me? Carbohydrates affect your blood glucose level more than any other type of food. Eating carbohydrates naturally increases the amount of glucose in your blood. Carbohydrate counting is a method for keeping track of how many carbohydrates you eat. Counting carbohydrates is important to keep your blood glucose at a healthy level, especially if you use insulin or take certain oral diabetes medicines. It is important to know how many carbohydrates you can safely have in each meal. This is different for every person. Your dietitian can help you calculate how many carbohydrates you should have at each meal and for snack. Foods that contain carbohydrates include: Bread, cereal, rice, pasta, and crackers. Potatoes and corn. Peas, beans, and lentils. Milk and yogurt. Fruit and juice. Desserts, such as cakes, cookies, ice cream, and candy.  How  does alcohol affect me? Alcohol can cause a sudden decrease in blood glucose (hypoglycemia), especially if you use insulin or take certain oral diabetes medicines. Hypoglycemia can be a life-threatening condition. Symptoms of hypoglycemia (sleepiness, dizziness, and confusion) are similar to symptoms of having too much alcohol. If your health care provider says that alcohol is safe for you, follow these guidelines: Limit alcohol intake to no more than 1 drink per day for nonpregnant women and 2 drinks per day for men. One drink equals 12 oz of beer, 5 oz of wine, or 1 oz of hard liquor. Do not drink on an empty stomach. Keep yourself hydrated with water, diet soda, or unsweetened iced tea. Keep in mind that regular soda, juice, and other mixers may contain a lot of sugar and must be counted as carbohydrates.  What are tips for following this plan? Reading food labels Start by checking the serving size on the label. The amount of calories, carbohydrates, fats, and other nutrients listed on the label are based on one serving of the food. Many foods contain more than one serving per package. Check the total grams (g) of carbohydrates in one serving. You can calculate the number of servings of carbohydrates in one serving by dividing the total carbohydrates by 15. For example, if a food has 30 g of total carbohydrates, it would be equal to 2 servings of carbohydrates. Check the number of grams (g) of saturated and trans fats in one serving. Choose foods that have low or no amount of these fats. Check the number of milligrams (mg) of sodium in one serving. Most people should limit total sodium intake  to less than 2,300 mg per day. Always check the nutrition information of foods labeled as "low-fat" or "nonfat". These foods may be higher in added sugar or refined carbohydrates and should be avoided. Talk to your dietitian to identify your daily goals for nutrients listed on the label. Shopping Avoid  buying canned, premade, or processed foods. These foods tend to be high in fat, sodium, and added sugar. Shop around the outside edge of the grocery store. This includes fresh fruits and vegetables, bulk grains, fresh meats, and fresh dairy. Cooking Use low-heat cooking methods, such as baking, instead of high-heat cooking methods like deep frying. Cook using healthy oils, such as olive, canola, or sunflower oil. Avoid cooking with butter, cream, or high-fat meats. Meal planning Eat meals and snacks regularly, preferably at the same times every day. Avoid going long periods of time without eating. Eat foods high in fiber, such as fresh fruits, vegetables, beans, and whole grains. Talk to your dietitian about how many servings of carbohydrates you can eat at each meal. Eat 4-6 ounces of lean protein each day, such as lean meat, chicken, fish, eggs, or tofu. 1 ounce is equal to 1 ounce of meat, chicken, or fish, 1 egg, or 1/4 cup of tofu. Eat some foods each day that contain healthy fats, such as avocado, nuts, seeds, and fish. Lifestyle  Check your blood glucose regularly. Exercise at least 30 minutes 5 or more days each week, or as told by your health care provider. Take medicines as told by your health care provider. Do not use any products that contain nicotine or tobacco, such as cigarettes and e-cigarettes. If you need help quitting, ask your health care provider. Work with a Veterinary surgeon or diabetes educator to identify strategies to manage stress and any emotional and social challenges. What are some questions to ask my health care provider? Do I need to meet with a diabetes educator? Do I need to meet with a dietitian? What number can I call if I have questions? When are the best times to check my blood glucose? Where to find more information: American Diabetes Association: diabetes.org/food-and-fitness/food Academy of Nutrition and Dietetics:  https://www.vargas.com/ [employer] of Diabetes and Digestive and Kidney Diseases (NIH): FindJewelers.cz Summary A healthy meal plan will help you control your blood glucose and maintain a healthy lifestyle. Working with a diet and nutrition specialist (dietitian) can help you make a meal plan that is best for you. Keep in mind that carbohydrates and alcohol have immediate effects on your blood glucose levels. It is important to count carbohydrates and to use alcohol carefully. This information is not intended to replace advice given to you by your health care provider. Make sure you discuss any questions you have with your health care provider. Document Released: 05/21/2005 Document Revised: 09/28/2016 Document Reviewed: 09/28/2016 Elsevier Interactive Patient Education  [employer].

## 2023-01-08 NOTE — Progress Notes (Signed)
Jeffrey Barnett is a 62 y.o. male presents to office today for annual physical exam examination.    Concerns today include: 1. Type 2 Diabetes with hypertension, hyperlipidemia:  Patient reports compliance with Crestor, which he actually has been taking his 10 mg daily rather than 20 mg other the other day.  He has had diet-controlled diabetes.  Taking 5 mg of Vasotec daily and this seems to be working well for him for blood pressure.  No chest pain, shortness of breath or dizziness reported.  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  ROI Last dental exam: Up-to-date Last colonoscopy: Up-to-date Refills needed today: None Immunizations needed: Immunization History  Administered Date(s) Administered   Influenza,inj,Quad PF,6+ Mos 08/08/2013, 09/13/2014, 09/01/2016, 07/29/2018, 10/02/2019, 06/24/2020, 09/17/2021, 07/10/2022   Influenza-Unspecified 07/10/2015   Moderna Sars-Covid-2 Vaccination 01/18/2020, 02/15/2020   Td 07/03/2008, 09/25/2019   Zoster Recombinat (Shingrix) 11/11/2018, 09/25/2019     Past Medical History:  Diagnosis Date   Fever blister    Hypertension    Other and unspecified hyperlipidemia    Villous  adenoma    Wax in ear    Social History   Socioeconomic History   Marital status: Married    Spouse name: Not on file   Number of children: Not on file   Years of education: Not on file   Highest education level: Not on file  Occupational History   Not on file  Tobacco Use   Smoking status: Never   Smokeless tobacco: Never  Vaping Use   Vaping Use: Never used  Substance and Sexual Activity   Alcohol use: No   Drug use: No   Sexual activity: Not on file  Other Topics Concern   Not on file  Social History Narrative   Not on file   Social Determinants of Health   Financial Resource Strain: Not on file  Food Insecurity: No Food Insecurity (09/29/2021)   Hunger Vital Sign    Worried About Running Out of Food in the Last Year: Never true    Ran Out of Food in the Last Year: Never true  Transportation Needs: Not on file  Physical Activity: Not on file  Stress: Not on file  Social Connections: Not on file  Intimate Partner Violence: Not on file   Past Surgical History:  Procedure Laterality Date   APPENDECTOMY     SHOULDER SURGERY Left y-3   Family History  Problem Relation Age of Onset   Dementia Mother    Thyroid disease Father     Current Outpatient Medications:    Cholecalciferol (VITAMIN D) 2000 UNITS CAPS, Take 1 capsule by mouth daily., Disp: , Rfl:    enalapril (VASOTEC) 10 MG tablet, Take 0.5 tablets (5 mg total) by mouth daily., Disp: 45 tablet, Rfl: 3   Omega-3 Fatty Acids (FISH OIL) 1000 MG  CAPS, Take 2 capsules by mouth daily., Disp: , Rfl:    rosuvastatin (CRESTOR) 20 MG tablet, TAKE 1 TABLET BY MOUTH EVERY OTHER DAY, Disp: 45 tablet, Rfl: 0   sildenafil (REVATIO) 20 MG tablet, Take 2-5 tablets as needed once daily for sexual activity, Disp: 100 tablet, Rfl: 0   valACYclovir (VALTREX) 1000 MG tablet, TAKE 1 TABLET BY MOUTH EVERY DAY AS DIRECTED, Disp: 90 tablet, Rfl: 0  No Known Allergies   ROS: Review of Systems Pertinent items noted in HPI and  remainder of comprehensive ROS otherwise negative.    Physical exam BP 125/80   Pulse 92   Temp 98.6 F (37 C)   Ht 6\' 1"  (1.854 m)   Wt 245 lb (111.1 kg)   SpO2 95%   BMI 32.32 kg/m  General appearance: alert, cooperative, appears stated age, mild distress, and moderately obese Head: Normocephalic, without obvious abnormality, atraumatic Eyes: negative findings: lids and lashes normal, conjunctivae and sclerae normal, corneas clear, and pupils equal, round, reactive to light and accomodation Ears: normal TM's and external ear canals both ears Nose: Nares normal. Septum midline. Mucosa normal. No drainage or sinus tenderness. Throat: lips, mucosa, and tongue normal; teeth and gums normal Neck: no adenopathy, supple, symmetrical, trachea midline, and thyroid not enlarged, symmetric, no tenderness/mass/nodules Back: symmetric, no curvature. ROM normal. No CVA tenderness. Lungs: clear to auscultation bilaterally Chest wall: no tenderness Heart: regular rate and rhythm, S1, S2 normal, no murmur, click, rub or gallop Abdomen: soft, non-tender; bowel sounds normal; no masses,  no organomegaly Extremities: extremities normal, atraumatic, no cyanosis or edema Pulses: 2+ and symmetric Skin:  Small abrasion noted on the great toe but otherwise unremarkable Lymph nodes: Cervical, supraclavicular, and axillary nodes normal. Neurologic: Grossly normal Psych: Mood stable, speech normal, affect appropriate  Diabetic Foot Exam - Simple   Simple Foot Form Diabetic Foot exam was performed with the following findings: Yes 01/11/2023  8:55 PM  Visual Inspection See comments: Yes Sensation Testing Intact to touch and monofilament testing bilaterally: Yes Pulse Check Posterior Tibialis and Dorsalis pulse intact bilaterally: Yes Comments Healing abrasion noted on the dorsal aspect of the right great toe     Assessment/ Plan: Jeffrey Barnett here for annual physical exam.   Annual physical  exam  Morbid obesity (HCC) - Plan: Lipid Panel, VITAMIN D 25 Hydroxy (Vit-D Deficiency, Fractures), VITAMIN D 25 Hydroxy (Vit-D Deficiency, Fractures), Lipid Panel  Diet-controlled diabetes mellitus (HCC) - Plan: CBC with Differential, CMP14+EGFR, Bayer DCA Hb A1c Waived, CMP14+EGFR, CBC with Differential, Bayer DCA Hb A1c Waived  Hypertension associated with diabetes (HCC) - Plan: CMP14+EGFR, CMP14+EGFR  Hyperlipidemia associated with type 2 diabetes mellitus (HCC) - Plan: TSH, CMP14+EGFR, Lipid Panel, Lipid Panel, CMP14+EGFR, TSH  Screening for malignant neoplasm of prostate - Plan: PSA, PSA  Has gained a little bit of weight back since our last visit.  I worry that this may precipitate uncontrolled blood sugar at some point.  We will check fasting labs.  Continue statin.  Current blood pressure control  Check PSA.  Patient is not symptomatic  Counseled on healthy lifestyle choices, including diet (rich in fruits, vegetables and lean meats and low in salt and simple carbohydrates) and exercise (at least 30 minutes of moderate physical activity daily).  Patient to follow up in 77m for DM  Dilana Mcphie M. Nadine Counts, DO

## 2023-01-09 LAB — CBC WITH DIFFERENTIAL/PLATELET
Basophils Absolute: 0 10*3/uL (ref 0.0–0.2)
EOS (ABSOLUTE): 0.1 10*3/uL (ref 0.0–0.4)
Eos: 1 %
Hematocrit: 46.5 % (ref 37.5–51.0)
Hemoglobin: 15.7 g/dL (ref 13.0–17.7)
Immature Grans (Abs): 0 10*3/uL (ref 0.0–0.1)
Lymphocytes Absolute: 1.5 10*3/uL (ref 0.7–3.1)
MCH: 29.5 pg (ref 26.6–33.0)
MCHC: 33.8 g/dL (ref 31.5–35.7)
MCV: 87 fL (ref 79–97)
Platelets: 217 10*3/uL (ref 150–450)
RBC: 5.33 x10E6/uL (ref 4.14–5.80)
WBC: 6.5 10*3/uL (ref 3.4–10.8)

## 2023-01-09 LAB — LIPID PANEL
Chol/HDL Ratio: 2.7 ratio (ref 0.0–5.0)
Cholesterol, Total: 116 mg/dL (ref 100–199)
HDL: 43 mg/dL (ref >39)
Triglycerides: 90 mg/dL (ref 0–149)

## 2023-01-09 LAB — CMP14+EGFR
ALT: 19 IU/L (ref 0–44)
AST: 24 IU/L (ref 0–40)
Albumin/Globulin Ratio: 1.9 (ref 1.2–2.2)
Albumin: 4.5 g/dL (ref 3.9–4.9)
Alkaline Phosphatase: 84 IU/L (ref 44–121)
BUN: 17 mg/dL (ref 8–27)
Bilirubin Total: 0.8 mg/dL (ref 0.0–1.2)
CO2: 21 mmol/L (ref 20–29)
Calcium: 9.4 mg/dL (ref 8.6–10.2)
Chloride: 99 mmol/L (ref 96–106)
Globulin, Total: 2.4 g/dL (ref 1.5–4.5)
Glucose: 145 mg/dL — ABNORMAL HIGH (ref 70–99)
Potassium: 4.8 mmol/L (ref 3.5–5.2)
Sodium: 136 mmol/L (ref 134–144)
Total Protein: 6.9 g/dL (ref 6.0–8.5)
eGFR: 75 mL/min/{1.73_m2} (ref >59)

## 2023-01-09 LAB — VITAMIN D 25 HYDROXY (VIT D DEFICIENCY, FRACTURES): Vit D, 25-Hydroxy: 38.3 ng/mL (ref 30.0–100.0)

## 2023-02-22 ENCOUNTER — Encounter: Payer: Self-pay | Admitting: *Deleted

## 2023-02-28 ENCOUNTER — Other Ambulatory Visit: Payer: Self-pay | Admitting: Family Medicine

## 2023-02-28 DIAGNOSIS — E7800 Pure hypercholesterolemia, unspecified: Secondary | ICD-10-CM

## 2023-02-28 DIAGNOSIS — I152 Hypertension secondary to endocrine disorders: Secondary | ICD-10-CM

## 2023-05-27 ENCOUNTER — Encounter: Payer: Self-pay | Admitting: Family

## 2023-05-27 ENCOUNTER — Telehealth (INDEPENDENT_AMBULATORY_CARE_PROVIDER_SITE_OTHER): Payer: BC Managed Care – PPO | Admitting: Family

## 2023-05-27 ENCOUNTER — Encounter: Payer: Self-pay | Admitting: Family Medicine

## 2023-05-27 DIAGNOSIS — J209 Acute bronchitis, unspecified: Secondary | ICD-10-CM | POA: Diagnosis not present

## 2023-05-27 MED ORDER — PROMETHAZINE-DM 6.25-15 MG/5ML PO SYRP
5.0000 mL | ORAL_SOLUTION | Freq: Three times a day (TID) | ORAL | 0 refills | Status: DC | PRN
Start: 2023-05-27 — End: 2023-07-09

## 2023-05-27 MED ORDER — PREDNISONE 10 MG (21) PO TBPK
ORAL_TABLET | ORAL | 0 refills | Status: DC
Start: 2023-05-27 — End: 2023-07-09

## 2023-05-27 MED ORDER — BENZONATATE 200 MG PO CAPS
200.0000 mg | ORAL_CAPSULE | Freq: Three times a day (TID) | ORAL | 1 refills | Status: DC | PRN
Start: 2023-05-27 — End: 2023-07-09

## 2023-05-27 NOTE — Progress Notes (Signed)
Virtual Visit Consent   Jeffrey Barnett, you are scheduled for a virtual visit with a [facility] provider today. Just as with appointments in the office, your consent must be obtained to participate. Your consent will be active for this visit and any virtual visit you may have with one of our providers in the next 365 days. If you have a MyChart account, a copy of this consent can be sent to you electronically.  As this is a virtual visit, video technology does not allow for your provider to perform a traditional examination. This may limit your provider's ability to fully assess your condition. If your provider identifies any concerns that need to be evaluated in person or the need to arrange testing (such as labs, EKG, etc.), we will make arrangements to do so. Although advances in technology are sophisticated, we cannot ensure that it will always work on either your end or our end. If the connection with a video visit is poor, the visit may have to be switched to a telephone visit. With either a video or telephone visit, we are not always able to ensure that we have a secure connection.  By engaging in this virtual visit, you consent to the provision of healthcare and authorize for your insurance to be billed (if applicable) for the services provided during this visit. Depending on your insurance coverage, you may receive a charge related to this service.  I need to obtain your verbal consent now. Are you willing to proceed with your visit today? DINA MURGA has provided verbal consent on 05/27/2023 for a virtual visit (video or telephone). Jannifer Rodney, FNP  Date: 05/27/2023 1:55 PM  Virtual Visit via Video Note   I, Jannifer Rodney, connected with  Jeffrey Barnett  ([id number], 1961/01/20) on 05/27/23 at 11:40 AM EDT by a video-enabled telemedicine application and verified that I am speaking with the correct person using two identifiers.  Location: Patient: Virtual Visit Location Patient:  work Provider: Pharmacist, community: Home Office   I discussed the limitations of evaluation and management by telemedicine and the availability of in person appointments. The patient expressed understanding and agreed to proceed.    History of Present Illness: Jeffrey Barnett is a 62 y.o. who identifies as a male who was assigned male at birth, and is being seen today for congestion and cough that started last week.  HPI: Cough This is a new problem. The current episode started 1 to 4 weeks ago. The problem has been unchanged. The problem occurs every few minutes. The cough is Productive of sputum. Associated symptoms include headaches. Pertinent negatives include no chills, ear congestion, ear pain, fever, myalgias, nasal congestion, postnasal drip, shortness of breath or wheezing. He has tried OTC cough suppressant and rest for the symptoms. The treatment provided mild relief.    Problems:  Patient Active Problem List   Diagnosis Date Noted   DM (diabetes mellitus), type 2 with complications (HCC) 10/05/2021   Elevated PSA 11/08/2019   Vitamin D deficiency 05/12/2019   Hypertension associated with diabetes (HCC) 04/03/2013   Fever blister, history of    Hyperlipidemia associated with type 2 diabetes mellitus (HCC)    Villous adenoma, history of     Allergies: No Known Allergies Medications:  Current Outpatient Medications:    benzonatate (TESSALON) 200 MG capsule, Take 1 capsule (200 mg total) by mouth 3 (three) times daily as needed., Disp: 30 capsule, Rfl: 1   predniSONE (STERAPRED UNI-PAK 21  TAB) 10 MG (21) TBPK tablet, Use as directed, Disp: 21 tablet, Rfl: 0   promethazine-dextromethorphan (PROMETHAZINE-DM) 6.25-15 MG/5ML syrup, Take 5 mLs by mouth 3 (three) times daily as needed for cough., Disp: 118 mL, Rfl: 0   Cholecalciferol (VITAMIN D) 2000 UNITS CAPS, Take 1 capsule by mouth daily., Disp: , Rfl:    enalapril (VASOTEC) 10 MG tablet, TAKE 1/2 TABLET BY MOUTH DAILY,  Disp: 45 tablet, Rfl: 1   Omega-3 Fatty Acids (FISH OIL) 1000 MG CAPS, Take 2 capsules by mouth daily., Disp: , Rfl:    rosuvastatin (CRESTOR) 20 MG tablet, TAKE 1 TABLET BY MOUTH EVERY OTHER DAY, Disp: 45 tablet, Rfl: 1   sildenafil (REVATIO) 20 MG tablet, Take 2-5 tablets as needed once daily for sexual activity, Disp: 100 tablet, Rfl: 0   valACYclovir (VALTREX) 1000 MG tablet, TAKE 1 TABLET BY MOUTH EVERY DAY AS DIRECTED, Disp: 90 tablet, Rfl: 0  Observations/Objective: Patient is well-developed, well-nourished in no acute distress.  Resting comfortably    Head is normocephalic, atraumatic.  No labored breathing.  Speech is clear and coherent with logical content.  Patient is alert and oriented at baseline.  Coarse cough  Assessment and Plan: 1. Acute bronchitis, unspecified organism - predniSONE (STERAPRED UNI-PAK 21 TAB) 10 MG (21) TBPK tablet; Use as directed  Dispense: 21 tablet; Refill: 0 - benzonatate (TESSALON) 200 MG capsule; Take 1 capsule (200 mg total) by mouth 3 (three) times daily as needed.  Dispense: 30 capsule; Refill: 1 - promethazine-dextromethorphan (PROMETHAZINE-DM) 6.25-15 MG/5ML syrup; Take 5 mLs by mouth 3 (three) times daily as needed for cough.  Dispense: 118 mL; Refill: 0  Strict low carb diet  Start prednisone  - Take meds as prescribed - Use a cool mist humidifier  -Use saline nose sprays frequently -Force fluids -For any cough or congestion  Use plain Mucinex- regular strength or max strength is fine -For fever or aces or pains- take tylenol or ibuprofen. -Throat lozenges if help Follow up if symptoms worsen or do not improve   Follow Up Instructions: I discussed the assessment and treatment plan with the patient. The patient was provided an opportunity to ask questions and all were answered. The patient agreed with the plan and demonstrated an understanding of the instructions.  A copy of instructions were sent to the patient via MyChart unless  otherwise noted below.    The patient was advised to call back or seek an in-person evaluation if the symptoms worsen or if the condition fails to improve as anticipated.  Time:  I spent 9 minutes with the patient via telehealth technology discussing the above problems/concerns.    Jannifer Rodney, FNP

## 2023-05-31 ENCOUNTER — Ambulatory Visit: Payer: BC Managed Care – PPO

## 2023-05-31 DIAGNOSIS — E118 Type 2 diabetes mellitus with unspecified complications: Secondary | ICD-10-CM | POA: Diagnosis not present

## 2023-05-31 LAB — HM DIABETES EYE EXAM

## 2023-05-31 NOTE — Progress Notes (Signed)
Jeffrey Barnett arrived 05/31/2023 and has given verbal consent to obtain images and complete their overdue diabetic retinal screening.  The images have been sent to an ophthalmologist or optometrist for review and interpretation.  Results will be sent back to Raliegh Ip, DO for review.  Patient has been informed they will be contacted when we receive the results via telephone or MyChart

## 2023-07-07 ENCOUNTER — Other Ambulatory Visit: Payer: Self-pay | Admitting: Family Medicine

## 2023-07-07 DIAGNOSIS — E7800 Pure hypercholesterolemia, unspecified: Secondary | ICD-10-CM

## 2023-07-09 ENCOUNTER — Encounter: Payer: Self-pay | Admitting: Family Medicine

## 2023-07-09 ENCOUNTER — Ambulatory Visit: Payer: BC Managed Care – PPO | Admitting: Family Medicine

## 2023-07-09 VITALS — BP 139/81 | HR 96 | Temp 98.5°F | Ht 73.0 in | Wt 250.8 lb

## 2023-07-09 DIAGNOSIS — E1159 Type 2 diabetes mellitus with other circulatory complications: Secondary | ICD-10-CM

## 2023-07-09 DIAGNOSIS — E118 Type 2 diabetes mellitus with unspecified complications: Secondary | ICD-10-CM

## 2023-07-09 DIAGNOSIS — I152 Hypertension secondary to endocrine disorders: Secondary | ICD-10-CM

## 2023-07-09 DIAGNOSIS — E785 Hyperlipidemia, unspecified: Secondary | ICD-10-CM

## 2023-07-09 DIAGNOSIS — R3911 Hesitancy of micturition: Secondary | ICD-10-CM

## 2023-07-09 DIAGNOSIS — E1169 Type 2 diabetes mellitus with other specified complication: Secondary | ICD-10-CM

## 2023-07-09 DIAGNOSIS — L989 Disorder of the skin and subcutaneous tissue, unspecified: Secondary | ICD-10-CM | POA: Diagnosis not present

## 2023-07-09 DIAGNOSIS — Z23 Encounter for immunization: Secondary | ICD-10-CM | POA: Diagnosis not present

## 2023-07-09 LAB — BAYER DCA HB A1C WAIVED: HB A1C (BAYER DCA - WAIVED): 7.1 % — ABNORMAL HIGH (ref 4.8–5.6)

## 2023-07-09 MED ORDER — TAMSULOSIN HCL 0.4 MG PO CAPS: 0.4000 mg | ORAL_CAPSULE | Freq: Every day | ORAL | 1 refills | Status: DC

## 2023-07-09 NOTE — Patient Instructions (Signed)
Continue to monitor your blood sugars as we discussed.  Take your medication as directed.    Understanding your Hemoglobin A1c:     Diabetes Mellitus and Nutrition When you have diabetes (diabetes mellitus), it is very important to have healthy eating habits because your blood sugar (glucose) levels are greatly affected by what you eat and drink. Eating healthy foods in the appropriate amounts, at about the same times every day, can help you: Control your blood glucose. Lower your risk of heart disease. Improve your blood pressure. Reach or maintain a healthy weight.  Every person with diabetes is different, and each person has different needs for a meal plan. Your health care provider may recommend that you work with a diet and nutrition specialist (dietitian) to make a meal plan that is best for you. Your meal plan may vary depending on factors such as: The calories you need. The medicines you take. Your weight. Your blood glucose, blood pressure, and cholesterol levels. Your activity level. Other health conditions you have, such as heart or kidney disease.  How do carbohydrates affect me? Carbohydrates affect your blood glucose level more than any other type of food. Eating carbohydrates naturally increases the amount of glucose in your blood. Carbohydrate counting is a method for keeping track of how many carbohydrates you eat. Counting carbohydrates is important to keep your blood glucose at a healthy level, especially if you use insulin or take certain oral diabetes medicines. It is important to know how many carbohydrates you can safely have in each meal. This is different for every person. Your dietitian can help you calculate how many carbohydrates you should have at each meal and for snack. Foods that contain carbohydrates include: Bread, cereal, rice, pasta, and crackers. Potatoes and corn. Peas, beans, and lentils. Milk and yogurt. Fruit and juice. Desserts, such as cakes,  cookies, ice cream, and candy.  How does alcohol affect me? Alcohol can cause a sudden decrease in blood glucose (hypoglycemia), especially if you use insulin or take certain oral diabetes medicines. Hypoglycemia can be a life-threatening condition. Symptoms of hypoglycemia (sleepiness, dizziness, and confusion) are similar to symptoms of having too much alcohol. If your health care provider says that alcohol is safe for you, follow these guidelines: Limit alcohol intake to no more than 1 drink per day for nonpregnant women and 2 drinks per day for men. One drink equals 12 oz of beer, 5 oz of wine, or 1 oz of hard liquor. Do not drink on an empty stomach. Keep yourself hydrated with water, diet soda, or unsweetened iced tea. Keep in mind that regular soda, juice, and other mixers may contain a lot of sugar and must be counted as carbohydrates.  What are tips for following this plan? Reading food labels Start by checking the serving size on the label. The amount of calories, carbohydrates, fats, and other nutrients listed on the label are based on one serving of the food. Many foods contain more than one serving per package. Check the total grams (g) of carbohydrates in one serving. You can calculate the number of servings of carbohydrates in one serving by dividing the total carbohydrates by 15. For example, if a food has 30 g of total carbohydrates, it would be equal to 2 servings of carbohydrates. Check the number of grams (g) of saturated and trans fats in one serving. Choose foods that have low or no amount of these fats. Check the number of milligrams (mg) of sodium in one serving.  Most people should limit total sodium intake to less than 2,300 mg per day. Always check the nutrition information of foods labeled as "low-fat" or "nonfat". These foods may be higher in added sugar or refined carbohydrates and should be avoided. Talk to your dietitian to identify your daily goals for nutrients  listed on the label. Shopping Avoid buying canned, premade, or processed foods. These foods tend to be high in fat, sodium, and added sugar. Shop around the outside edge of the grocery store. This includes fresh fruits and vegetables, bulk grains, fresh meats, and fresh dairy. Cooking Use low-heat cooking methods, such as baking, instead of high-heat cooking methods like deep frying. Cook using healthy oils, such as olive, canola, or sunflower oil. Avoid cooking with butter, cream, or high-fat meats. Meal planning Eat meals and snacks regularly, preferably at the same times every day. Avoid going long periods of time without eating. Eat foods high in fiber, such as fresh fruits, vegetables, beans, and whole grains. Talk to your dietitian about how many servings of carbohydrates you can eat at each meal. Eat 4-6 ounces of lean protein each day, such as lean meat, chicken, fish, eggs, or tofu. 1 ounce is equal to 1 ounce of meat, chicken, or fish, 1 egg, or 1/4 cup of tofu. Eat some foods each day that contain healthy fats, such as avocado, nuts, seeds, and fish. Lifestyle  Check your blood glucose regularly. Exercise at least 30 minutes 5 or more days each week, or as told by your health care provider. Take medicines as told by your health care provider. Do not use any products that contain nicotine or tobacco, such as cigarettes and e-cigarettes. If you need help quitting, ask your health care provider. Work with a Social worker or diabetes educator to identify strategies to manage stress and any emotional and social challenges. What are some questions to ask my health care provider? Do I need to meet with a diabetes educator? Do I need to meet with a dietitian? What number can I call if I have questions? When are the best times to check my blood glucose? Where to find more information: American Diabetes Association: diabetes.org/food-and-fitness/food Academy of Nutrition and Dietetics:  PokerClues.dk [employer] of Diabetes and Digestive and Kidney Diseases (NIH): ContactWire.be Summary A healthy meal plan will help you control your blood glucose and maintain a healthy lifestyle. Working with a diet and nutrition specialist (dietitian) can help you make a meal plan that is best for you. Keep in mind that carbohydrates and alcohol have immediate effects on your blood glucose levels. It is important to count carbohydrates and to use alcohol carefully. This information is not intended to replace advice given to you by your health care provider. Make sure you discuss any questions you have with your health care provider. Document Released: 05/21/2005 Document Revised: 09/28/2016 Document Reviewed: 09/28/2016 Elsevier Interactive Patient Education  [employer].

## 2023-07-09 NOTE — Progress Notes (Signed)
Subjective: CC:DM PCP: Raliegh Ip, DO Jeffrey Barnett is a 62 y.o. male presenting to clinic today for:  1. Type 2 Diabetes with hypertension, hyperlipidemia:  He is not routinely checking blood sugars.  He is compliant with his Crestor and Vasotec.  Diabetes has been diet controlled.  He was treated with oral steroids in September for sinus infection.  Diabetes Health Maintenance Due  Topic Date Due   HEMOGLOBIN A1C  07/11/2023   FOOT EXAM  01/08/2024   OPHTHALMOLOGY EXAM  05/30/2024    Last A1c:  Lab Results  Component Value Date   HGBA1C 6.6 (H) 01/08/2023    ROS: No chest pain, shortness of breath, dizziness.  He reports urinary hesitancy at baseline and this has not been worse than normal.  No polydipsia or polyuria.  Would be interested in going on something for prostate to see if this might help urinary frequency however  2.  Skin lesion Has had a skin lesion on the left shoulder for about a year now.  It seems to flake and then crust over and then he peels that it and it ulcerates.  [place] better [place] did [place] the beach but his daughter is concerned so he is ready to have a dermatologist look at it  ROS: Per HPI  No Known Allergies Past Medical History:  Diagnosis Date   Fever blister    Hypertension    Other and unspecified hyperlipidemia    Villous adenoma    Wax in ear     Current Outpatient Medications:    Cholecalciferol (VITAMIN D) 2000 UNITS CAPS, Take 1 capsule by mouth daily., Disp: , Rfl:    enalapril (VASOTEC) 10 MG tablet, TAKE 1/2 TABLET BY MOUTH DAILY, Disp: 45 tablet, Rfl: 1   Omega-3 Fatty Acids (FISH OIL) 1000 MG CAPS, Take 2 capsules by mouth daily., Disp: , Rfl:    rosuvastatin (CRESTOR) 20 MG tablet, TAKE 1 TABLET BY MOUTH EVERY OTHER DAY, Disp: 45 tablet, Rfl: 0   sildenafil (REVATIO) 20 MG tablet, Take 2-5 tablets as needed once daily for sexual activity, Disp: 100 tablet, Rfl: 0   valACYclovir (VALTREX) 1000 MG tablet, TAKE 1  TABLET BY MOUTH EVERY DAY AS DIRECTED, Disp: 90 tablet, Rfl: 0 Social History   Socioeconomic History   Marital status: Married    Spouse name: Not on file   Number of children: Not on file   Years of education: Not on file   Highest education level: Not on file  Occupational History   Not on file  Tobacco Use   Smoking status: Never   Smokeless tobacco: Never  Vaping Use   Vaping status: Never Used  Substance and Sexual Activity   Alcohol use: No   Drug use: No   Sexual activity: Not on file  Other Topics Concern   Not on file  Social History Narrative   Not on file   Social Determinants of Health   Financial Resource Strain: Not on file  Food Insecurity: No Food Insecurity (09/29/2021)   Hunger Vital Sign    Worried About Running Out of Food in the Last Year: Never true    Ran Out of Food in the Last Year: Never true  Transportation Needs: Not on file  Physical Activity: Not on file  Stress: Not on file  Social Connections: Not on file  Intimate Partner Violence: Not on file   Family History  Problem Relation Age of Onset   Dementia Mother  Thyroid disease Father     Objective: Office vital signs reviewed. BP 139/81   Pulse 96   Temp 98.5 F (36.9 C)   Ht 6\' 1"  (1.854 m)   Wt 250 lb 12.8 oz (113.8 kg)   SpO2 96%   BMI 33.09 kg/m   Physical Examination:  General: Awake, alert, well nourished, No acute distress HEENT: sclera white, MMM Cardio: regular rate and rhythm, S1S2 heard, no murmurs appreciated Pulm: clear to auscultation bilaterally, no wheezes, rhonchi or rales; normal work of breathing on room air Skin: Irregularly-shaped, slightly raised lesion on the left anterior lateral shoulder that has central ulceration.  No active bleeding.  No exudates or evidence of secondary infection  Assessment/ Plan: 62 y.o. male   DM (diabetes mellitus), type 2 with complications (HCC) - Plan: Microalbumin / creatinine urine ratio, Bayer DCA Hb A1c  Waived  Hyperlipidemia associated with type 2 diabetes mellitus (HCC)  Hypertension associated with diabetes (HCC) - Plan: Microalbumin / creatinine urine ratio  Urinary hesitancy - Plan: tamsulosin (FLOMAX) 0.4 MG CAPS capsule  Skin lesion of left upper extremity - Plan: Ambulatory referral to Dermatology  Encounter for immunization - Plan: Flu vaccine trivalent PF, 6mos and older(Flulaval,Afluria,Fluarix,Fluzone)  Diet controlled DM previously but A1c up to 7.1 today.  Difficult to tell if this is secondary to dietary issues versus recent oral steroid use.  I have advised him to start checking blood sugars regularly and if persistently above 125 I want him calling me and we can consider starting medications at that point.  Urine microalbumin was collected today.  His influenza shot was performed  He will continue statin.  Not yet due for fasting lipid  Continue current blood pressure regimen.  Blood pressure was controlled  Trial of Flomax for urinary hesitancy  Referral to dermatology for skin lesion concerning for possible squamous cell  Follow up 104m for CPE w/ fasting labs.  Raliegh Ip, DO [facility] [phone]

## 2023-07-11 LAB — MICROALBUMIN / CREATININE URINE RATIO
Creatinine, Urine: 149.8 mg/dL
Microalb/Creat Ratio: 9 mg/g{creat} (ref 0–29)
Microalbumin, Urine: 14 ug/mL

## 2023-08-02 ENCOUNTER — Encounter: Payer: Self-pay | Admitting: Dermatology

## 2023-08-02 ENCOUNTER — Ambulatory Visit: Payer: BC Managed Care – PPO | Admitting: Dermatology

## 2023-08-02 VITALS — BP 130/87 | HR 75

## 2023-08-02 DIAGNOSIS — B078 Other viral warts: Secondary | ICD-10-CM | POA: Diagnosis not present

## 2023-08-02 DIAGNOSIS — L820 Inflamed seborrheic keratosis: Secondary | ICD-10-CM

## 2023-08-02 DIAGNOSIS — L578 Other skin changes due to chronic exposure to nonionizing radiation: Secondary | ICD-10-CM

## 2023-08-02 DIAGNOSIS — W908XXA Exposure to other nonionizing radiation, initial encounter: Secondary | ICD-10-CM | POA: Diagnosis not present

## 2023-08-02 DIAGNOSIS — Z808 Family history of malignant neoplasm of other organs or systems: Secondary | ICD-10-CM

## 2023-08-02 DIAGNOSIS — Z1283 Encounter for screening for malignant neoplasm of skin: Secondary | ICD-10-CM

## 2023-08-02 DIAGNOSIS — D2262 Melanocytic nevi of left upper limb, including shoulder: Secondary | ICD-10-CM

## 2023-08-02 DIAGNOSIS — L821 Other seborrheic keratosis: Secondary | ICD-10-CM

## 2023-08-02 DIAGNOSIS — D1801 Hemangioma of skin and subcutaneous tissue: Secondary | ICD-10-CM

## 2023-08-02 DIAGNOSIS — C44619 Basal cell carcinoma of skin of left upper limb, including shoulder: Secondary | ICD-10-CM | POA: Diagnosis not present

## 2023-08-02 DIAGNOSIS — L814 Other melanin hyperpigmentation: Secondary | ICD-10-CM

## 2023-08-02 DIAGNOSIS — D492 Neoplasm of unspecified behavior of bone, soft tissue, and skin: Secondary | ICD-10-CM | POA: Diagnosis not present

## 2023-08-02 DIAGNOSIS — D229 Melanocytic nevi, unspecified: Secondary | ICD-10-CM

## 2023-08-02 DIAGNOSIS — D485 Neoplasm of uncertain behavior of skin: Secondary | ICD-10-CM

## 2023-08-02 NOTE — Patient Instructions (Addendum)
Cryotherapy Aftercare  Wash gently with soap and water everyday.   Apply Vaseline and Band-Aid daily until healed.  Patient Handout: Wound Care for Skin Biopsy Site  Taking Care of Your Skin Biopsy Site  Proper care of the biopsy site is essential for promoting healing and minimizing scarring. This handout provides instructions on how to care for your biopsy site to ensure optimal recovery.  1. Cleaning the Wound:  Clean the biopsy site daily with gentle soap and water. Gently pat the area dry with a clean, soft towel. Avoid harsh scrubbing or rubbing the area, as this can irritate the skin and delay healing.  2. Applying Aquaphor and Bandage:  After cleaning the wound, apply a thin layer of Aquaphor ointment to the biopsy site. Cover the area with a sterile bandage to protect it from dirt, bacteria, and friction. Change the bandage daily or as needed if it becomes soiled or wet.  3. Continued Care for One Week:  Repeat the cleaning, Aquaphor application, and bandaging process daily for one week following the biopsy procedure. Keeping the wound clean and moist during this initial healing period will help prevent infection and promote optimal healing.  4. Massaging Aquaphor into the Area:  ---After one week, discontinue the use of bandages but continue to apply Aquaphor to the biopsy site. ----Gently massage the Aquaphor into the area using circular motions. ---Massaging the skin helps to promote circulation and prevent the formation of scar tissue.   Additional Tips:  Avoid exposing the biopsy site to direct sunlight during the healing process, as this can cause hyperpigmentation or worsen scarring. If you experience any signs of infection, such as increased redness, swelling, warmth, or drainage from the wound, contact your healthcare provider immediately. Follow any additional instructions provided by your healthcare provider for caring for the biopsy site and managing any  discomfort. Conclusion:  Taking proper care of your skin biopsy site is crucial for ensuring optimal healing and minimizing scarring. By following these instructions for cleaning, applying Aquaphor, and massaging the area, you can promote a smooth and successful recovery. If you have any questions or concerns about caring for your biopsy site, don't hesitate to contact your healthcare provider for guidance.    Important Information   Due to recent changes in healthcare laws, you may see results of your pathology and/or laboratory studies on MyChart before the doctors have had a chance to review them. We understand that in some cases there may be results that are confusing or concerning to you. Please understand that not all results are received at the same time and often the doctors may need to interpret multiple results in order to provide you with the best plan of care or course of treatment. Therefore, we ask that you please give [place] 2 business days to thoroughly review all your results before contacting the office for clarification. Should we see a critical lab result, you will be contacted sooner.     If You Need Anything After Your Visit   If you have any questions or concerns for your doctor, please call our main line at [phone]. If no one answers, please leave a voicemail as directed and we will return your call as soon as possible. Messages left after 4 pm will be answered the following business day.    You may also send [place] a message via MyChart. We typically respond to MyChart messages within 1-2 business days.  For prescription refills, please ask your pharmacy to contact our  office. Our fax number is [phone].  If you have an urgent issue when the clinic is closed that cannot wait until the next business day, you can page your doctor at the number below.     Please note that while we do our best to be available for urgent issues outside of office hours, we are not available  24/7.    If you have an urgent issue and are unable to reach [place], you may choose to seek medical care at your doctor's office, retail clinic, urgent care center, or emergency room.   If you have a medical emergency, please immediately call 911 or go to the emergency department. In the event of inclement weather, please call our main line at [phone] for an update on the status of any delays or closures.  Dermatology Medication Tips: Please keep the boxes that topical medications come in in order to help keep track of the instructions about where and how to use these. Pharmacies typically print the medication instructions only on the boxes and not directly on the medication tubes.   If your medication is too expensive, please contact our office at [phone] or send [place] a message through MyChart.    We are unable to tell what your co-pay for medications will be in advance as this is different depending on your insurance coverage. However, we may be able to find a substitute medication at lower cost or fill out paperwork to get insurance to cover a needed medication.    If a prior authorization is required to get your medication covered by your insurance company, please allow [place] 1-2 business days to complete this process.   Drug prices often vary depending on where the prescription is filled and some pharmacies may offer cheaper prices.   The website www.goodrx.com contains coupons for medications through different pharmacies. The prices here do not account for what the cost may be with help from insurance (it may be cheaper with your insurance), but the website can give you the price if you did not use any insurance.  - You can print the associated coupon and take it with your prescription to the pharmacy.  - You may also stop by our office during regular business hours and pick up a GoodRx coupon card.  - If you need your prescription sent electronically to a different pharmacy, notify our  office through [facility] or by phone at [phone]

## 2023-08-02 NOTE — Progress Notes (Signed)
New Patient Visit   Subjective  Jeffrey Barnett is a 62 y.o. male who presents for the following: Skin Cancer Screening and Upper Body Skin Exam  The patient presents for Upper Body Skin Exam (UBSE) for skin cancer screening and mole check. The patient has spots, moles and lesions to be evaluated, some may be new or changing and the patient may have concern these could be cancer. Pt has no hx of skin cancer but has had some things removed that came back benign. He has family hx of skin cancer and MM Pt has a growth on left chest/shoulder area that was raised and would bleed. It seems to have resolved but still wants it checked   The following portions of the chart were reviewed this encounter and updated as appropriate: medications, allergies, medical history  Review of Systems:  No other skin or systemic complaints except as noted in HPI or Assessment and Plan.  Objective  Well appearing patient in no apparent distress; mood and affect are within normal limits.  All skin waist up examined. Relevant physical exam findings are noted in the Assessment and Plan.  Left shoulder 3cm pink plaque with vessels and pigmented nodule       Right Temple 1.5 cm irregular brown macule       Left superior Shoulder 1cm irregular brown macule         Assessment & Plan   Neoplasm of uncertain behavior of skin (3) Left shoulder  Skin / nail biopsy Type of biopsy: tangential   Informed consent: discussed and consent obtained   Timeout: patient name, date of birth, surgical site, and procedure verified   Procedure prep:  Patient was prepped and draped in usual sterile fashion Prep type:  Isopropyl alcohol Anesthesia: the lesion was anesthetized in a standard fashion   Anesthetic:  1% lidocaine w/ epinephrine 1-100,000 buffered w/ 8.4% NaHCO3 Instrument used: DermaBlade   Hemostasis achieved with: aluminum chloride   Outcome: patient tolerated procedure well   Post-procedure  details: sterile dressing applied and wound care instructions given   Dressing type: petrolatum gauze and bandage    Specimen 1 - Surgical pathology Differential Diagnosis: R/O BCC  Check Margins: No  Right Temple  Skin / nail biopsy Type of biopsy: tangential   Informed consent: discussed and consent obtained   Timeout: patient name, date of birth, surgical site, and procedure verified   Procedure prep:  Patient was prepped and draped in usual sterile fashion Prep type:  Isopropyl alcohol Anesthesia: the lesion was anesthetized in a standard fashion   Anesthetic:  1% lidocaine w/ epinephrine 1-100,000 buffered w/ 8.4% NaHCO3 Instrument used: DermaBlade   Hemostasis achieved with: aluminum chloride   Outcome: patient tolerated procedure well   Post-procedure details: sterile dressing applied and wound care instructions given   Dressing type: petrolatum gauze and bandage    Specimen 2 - Surgical pathology Differential Diagnosis: R/O DN vs sk  Check Margins: No  Left superior Shoulder  Skin / nail biopsy Type of biopsy: tangential   Informed consent: discussed and consent obtained   Timeout: patient name, date of birth, surgical site, and procedure verified   Procedure prep:  Patient was prepped and draped in usual sterile fashion Prep type:  Isopropyl alcohol Anesthesia: the lesion was anesthetized in a standard fashion   Anesthetic:  1% lidocaine w/ epinephrine 1-100,000 buffered w/ 8.4% NaHCO3 Instrument used: DermaBlade   Hemostasis achieved with: aluminum chloride   Outcome: patient tolerated procedure  well   Post-procedure details: sterile dressing applied and wound care instructions given   Dressing type: petrolatum gauze and bandage    Specimen 3 - Surgical pathology Differential Diagnosis: R/O DN vs other  Check Margins: No  Other viral warts Head - Anterior (Face)  Destruction of lesion - Head - Anterior (Face) Complexity: simple   Destruction method:  cryotherapy   Informed consent: discussed and consent obtained   Timeout:  patient name, date of birth, surgical site, and procedure verified Lesion destroyed using liquid nitrogen: Yes   Post-procedure details: wound care instructions given     Skin cancer screening performed today.  Actinic Damage - Chronic condition, secondary to cumulative UV/sun exposure - diffuse scaly erythematous macules with underlying dyspigmentation - Recommend daily broad spectrum sunscreen SPF 30+ to sun-exposed areas, reapply every 2 hours as needed.  - Staying in the shade or wearing long sleeves, sun glasses (UVA+UVB protection) and wide brim hats (4-inch brim around the entire circumference of the hat) are also recommended for sun protection.  - Call for new or changing lesions.  Melanocytic Nevi - Tan-brown and/or pink-flesh-colored symmetric macules and papules - Benign appearing on exam today - Observation - Call clinic for new or changing moles - Recommend daily use of broad spectrum spf 30+ sunscreen to sun-exposed areas.   SEBORRHEIC KERATOSIS - Stuck-on, waxy, tan-brown papules and/or plaques  - Benign-appearing - Discussed benign etiology and prognosis. - Observe - Call for any changes  LENTIGINES Exam: scattered tan macules Due to sun exposure Treatment Plan: Benign-appearing, observe. Recommend daily broad spectrum sunscreen SPF 30+ to sun-exposed areas, reapply every 2 hours as needed.  Call for any changes    HEMANGIOMA Exam: red papule(s) Discussed benign nature. Recommend observation. Call for changes.    Return in about 1 year (around 08/01/2024) for TBSE.  [name], CMA, am acting as scribe for [employer], DO.   Documentation: I have reviewed the above documentation for accuracy and completeness, and I agree with the above.  [name], DO

## 2023-08-09 LAB — SURGICAL PATHOLOGY

## 2023-08-12 ENCOUNTER — Telehealth: Payer: Self-pay

## 2023-08-12 ENCOUNTER — Encounter: Payer: Self-pay | Admitting: Dermatology

## 2023-08-12 DIAGNOSIS — C4491 Basal cell carcinoma of skin, unspecified: Secondary | ICD-10-CM | POA: Insufficient documentation

## 2023-08-12 DIAGNOSIS — D239 Other benign neoplasm of skin, unspecified: Secondary | ICD-10-CM | POA: Insufficient documentation

## 2023-08-12 NOTE — Telephone Encounter (Signed)
-----   Message from [name] sent at 08/12/2023  2:12 PM EST ----- Hi Demetruis Depaul,  Please call pt and notify their bx results were positive for a skin CA and an abnormal mole.  He will need treatment with Dr Caralyn Guile (detailed below)  Diagnosis 1. Skin , left shoulder  --> Mohs w/ Dr Caralyn Guile SUPERFICIAL AND NODULAR BASAL CELL CARCINOMA  2. Skin , right temple --> No tx necessary SEBORRHEIC KERATOSIS, IRRITATED  3. 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

## 2023-08-12 NOTE — Progress Notes (Signed)
Hi Jeffrey Barnett,  Please call pt and notify their bx results were positive for a skin CA and an abnormal mole.  He will need treatment with Dr Caralyn Guile (detailed below)  Diagnosis 1. Skin , left shoulder  --> Mohs w/ Dr Caralyn Guile SUPERFICIAL AND NODULAR BASAL CELL CARCINOMA  2. Skin , right temple --> No tx necessary SEBORRHEIC KERATOSIS, IRRITATED  3. 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

## 2023-08-17 NOTE — Telephone Encounter (Signed)
09/07/2023 at 9:15 AM with Dr. Caralyn Guile

## 2023-08-30 ENCOUNTER — Other Ambulatory Visit: Payer: Self-pay | Admitting: Family Medicine

## 2023-08-30 DIAGNOSIS — E1159 Type 2 diabetes mellitus with other circulatory complications: Secondary | ICD-10-CM

## 2023-09-03 ENCOUNTER — Encounter (INDEPENDENT_AMBULATORY_CARE_PROVIDER_SITE_OTHER): Payer: Self-pay | Admitting: Family Medicine

## 2023-09-03 ENCOUNTER — Encounter: Payer: Self-pay | Admitting: Dermatology

## 2023-09-03 DIAGNOSIS — Z03818 Encounter for observation for suspected exposure to other biological agents ruled out: Secondary | ICD-10-CM | POA: Diagnosis not present

## 2023-09-03 DIAGNOSIS — R051 Acute cough: Secondary | ICD-10-CM | POA: Diagnosis not present

## 2023-09-03 DIAGNOSIS — J069 Acute upper respiratory infection, unspecified: Secondary | ICD-10-CM | POA: Diagnosis not present

## 2023-09-03 DIAGNOSIS — R509 Fever, unspecified: Secondary | ICD-10-CM | POA: Diagnosis not present

## 2023-09-06 ENCOUNTER — Encounter: Payer: Self-pay | Admitting: Dermatology

## 2023-09-07 ENCOUNTER — Encounter: Payer: Self-pay | Admitting: Dermatology

## 2023-09-07 ENCOUNTER — Ambulatory Visit: Payer: BC Managed Care – PPO | Admitting: Dermatology

## 2023-09-07 VITALS — BP 139/93 | HR 104 | Temp 98.6°F

## 2023-09-07 DIAGNOSIS — D2362 Other benign neoplasm of skin of left upper limb, including shoulder: Secondary | ICD-10-CM | POA: Diagnosis not present

## 2023-09-07 DIAGNOSIS — C44619 Basal cell carcinoma of skin of left upper limb, including shoulder: Secondary | ICD-10-CM

## 2023-09-07 DIAGNOSIS — D2262 Melanocytic nevi of left upper limb, including shoulder: Secondary | ICD-10-CM | POA: Diagnosis not present

## 2023-09-07 DIAGNOSIS — D239 Other benign neoplasm of skin, unspecified: Secondary | ICD-10-CM

## 2023-09-07 DIAGNOSIS — L814 Other melanin hyperpigmentation: Secondary | ICD-10-CM

## 2023-09-07 DIAGNOSIS — L579 Skin changes due to chronic exposure to nonionizing radiation, unspecified: Secondary | ICD-10-CM

## 2023-09-07 MED ORDER — OXYCODONE HCL 5 MG PO TABS: 5.0000 mg | ORAL_TABLET | Freq: Four times a day (QID) | ORAL | 0 refills | Status: AC | PRN

## 2023-09-07 NOTE — Patient Instructions (Signed)
 Wound Care Instructions for After Surgery  On the day following your surgery, you should begin doing daily dressing changes until your sutures are removed: Remove the bandage. Cleanse the wound gently with soap and water.  Make sure you then dry the skin surrounding the wound completely or the tape will not stick to the skin. Do not use cotton balls on the wound. After the wound is clean and dry, apply the ointment (either prescription antibiotic prescribed by your doctor or plain Vaseline if nothing was prescribed) gently with a Q-tip. If you are using a bandaid to cover: Apply a bandaid large enough to cover the entire wound. If you do not have a bandaid large enough to cover the wound OR if you are sensitive to bandaid adhesive: Cut a non-stick pad (such as Telfa) to fit the size of the wound.  Cover the wound with the non-stick pad. If the wound is draining, you may want to add a small amount of gauze on top of the non-stick pad for a little added compression to the area. Use tape to seal the area completely.  For the next 1-2 weeks: Be sure to keep the wound moist with ointment 24/7 to ensure best healing. If you are unable to cover the wound with a bandage to hold the ointment in place, you may need to reapply the ointment several times a day. Do not bend over or lift heavy items to reduce the chance of elevated blood pressure to the wound. Do not participate in particularly strenuous activities.  Below is a list of dressing supplies you might need.  Cotton-tipped applicators - Q-tips Gauze pads (2x2 and/or 4x4) - All-Purpose Sponges New and clean tube of petroleum jelly (Vaseline) OR prescription antibiotic ointment if prescribed Either a bandaid large enough to cover the entire wound OR non-stick dressing material (Telfa) and Tape (Paper or Hypafix)  FOR ADULT SURGERY PATIENTS: If you need something for pain relief, you may take 1 extra strength Tylenol (acetaminophen) and 2  ibuprofen (200 mg) together every 4 hours as needed. (Do not take these medications if you are allergic to them or if you know you cannot take them for any other reason). Typically you may only need pain medication for 1-3 days.   Comments on the Post-Operative Period Slight swelling and redness often appear around the wound. This is normal and will disappear within several days following the surgery. The healing wound will drain a brownish-red-yellow discharge during healing. This is a normal phase of wound healing. As the wound begins to heal, the drainage may increase in amount. Again, this drainage is normal. Notify us if the drainage becomes persistently bloody, excessively swollen, or intensely painful or develops a foul odor or red streaks.  The healing wound will also typically be itchy. This is normal. If you have severe or persistent pain, Notify us if the discomfort is severe or persistent. Avoid alcoholic beverages when taking pain medicine.  In Case of Wound Hemorrhage A wound hemorrhage is when the bandage suddenly becomes soaked with bright red blood and flows profusely. If this happens, sit down or lie down with your head elevated. If the wound has a dressing on it, do not remove the dressing. Apply pressure to the existing gauze. If the wound is not covered, use a gauze pad to apply pressure and continue applying the pressure for 20 minutes without peeking. DO NOT COVER THE WOUND WITH A LARGE TOWEL OR WASH CLOTH. Release your hand from the  wound site but do not remove the dressing. If the bleeding has stopped, gently clean around the wound. Leave the dressing in place for 24 hours if possible. This wait time allows the blood vessels to close off so that you do not spark a new round of bleeding by disrupting the newly clotted blood vessels with an immediate dressing change. If the bleeding does not subside, continue to hold pressure for 40 minutes. If bleeding continues, page your  physician, contact an After Hours clinic or go to the Emergency Room.    Important Information  Due to recent changes in healthcare laws, you may see results of your pathology and/or laboratory studies on MyChart before the doctors have had a chance to review them. We understand that in some cases there may be results that are confusing or concerning to you. Please understand that not all results are received at the same time and often the doctors may need to interpret multiple results in order to provide you with the best plan of care or course of treatment. Therefore, we ask that you please give [place] 2 business days to thoroughly review all your results before contacting the office for clarification. Should we see a critical lab result, you will be contacted sooner.   If You Need Anything After Your Visit  If you have any questions or concerns for your doctor, please call our main line at [phone] If no one answers, please leave a voicemail as directed and we will return your call as soon as possible. Messages left after 4 pm will be answered the following business day.   You may also send [place] a message via MyChart. We typically respond to MyChart messages within 1-2 business days.  For prescription refills, please ask your pharmacy to contact our office. Our fax number is [phone].  If you have an urgent issue when the clinic is closed that cannot wait until the next business day, you can page your doctor at the number below.    Please note that while we do our best to be available for urgent issues outside of office hours, we are not available 24/7.   If you have an urgent issue and are unable to reach [place], you may choose to seek medical care at your doctor's office, retail clinic, urgent care center, or emergency room.  If you have a medical emergency, please immediately call 911 or go to the emergency department. In the event of inclement weather, please call our main line at  [phone] for an update on the status of any delays or closures.  Dermatology Medication Tips: Please keep the boxes that topical medications come in in order to help keep track of the instructions about where and how to use these. Pharmacies typically print the medication instructions only on the boxes and not directly on the medication tubes.   If your medication is too expensive, please contact our office at [phone] or send [place] a message through MyChart.   We are unable to tell what your co-pay for medications will be in advance as this is different depending on your insurance coverage. However, we may be able to find a substitute medication at lower cost or fill out paperwork to get insurance to cover a needed medication.   If a prior authorization is required to get your medication covered by your insurance company, please allow [place] 1-2 business days to complete this process.  Drug prices often vary depending on where the prescription is filled and  some pharmacies may offer cheaper prices.  The website www.goodrx.com contains coupons for medications through different pharmacies. The prices here do not account for what the cost may be with help from insurance (it may be cheaper with your insurance), but the website can give you the price if you did not use any insurance.  - You can print the associated coupon and take it with your prescription to the pharmacy.  - You may also stop by our office during regular business hours and pick up a GoodRx coupon card.  - If you need your prescription sent electronically to a different pharmacy, notify our office through [facility] or by phone at [phone]

## 2023-09-07 NOTE — Progress Notes (Signed)
 Follow-Up Visit   Subjective  Jeffrey Barnett is a 62 y.o. male who presents for the following: Mohs of a Superficial and Nodular BCC of left shoulder, referred by Dr. Alm. 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 Alm, that will be excised today since it falls within the closure lines of the Mohs closure.  The following portions of the chart were reviewed this encounter and updated as appropriate: medications, allergies, medical history  Review of Systems:  No other skin or systemic complaints except as noted in HPI or Assessment and Plan.  Objective  Well appearing patient in no apparent distress; mood and affect are within normal limits.  A focused examination was performed of the following areas:  Left shoulder  Relevant physical exam findings are noted in the Assessment and Plan.   Left Shoulder - Anterior Pink pearly papule or plaque with arborizing vessels.     Assessment & Plan   BASAL CELL CARCINOMA (BCC) OF SKIN OF LEFT UPPER EXTREMITY INCLUDING SHOULDER Left Shoulder - Anterior Mohs surgery  Consent obtained: written  Anticoagulation: Is the patient taking prescription anticoagulant and/or aspirin prescribed/recommended by a physician? No   Was the anticoagulation regimen changed prior to Mohs? No    Procedure Details: Timeout: pre-procedure verification complete Procedure Prep: patient was prepped and draped in usual sterile fashion Prep type: chlorhexidine Biopsy accession number: [id number] Biopsy lab: [facility] path Date of biopsy: 08/02/2023 Frozen section biopsy performed: No   Specimen debulked: No   Pre-Op diagnosis: basal cell carcinoma BCC subtype: superficial and nodular MohsAIQ Surgical site (if tumor spans multiple areas, please select predominant area): upper extremity Surgery side: left Surgical site (from skin exam): Left Shoulder - Anterior Pre-operative length (cm): 3.1 Pre-operative width (cm):  2.3 Indications for Mohs surgery: anatomic location where tissue conservation is critical Previously treated? No    Micrographic Surgery Details: Post-operative length (cm): 3.5 Post-operative width (cm): 2.8 Number of Mohs stages: 1 Is this a complex case (associate members only): No    Stage 1    Tumor features identified on Mohs section: no tumor identified    Depth of defect after stage: subcutaneous fat    Perineural invasion: no perineural invasion  Patient tolerance of procedure: tolerated well, no immediate complications  Reconstruction: Was the defect reconstructed? Yes   Was reconstruction performed by the same Mohs surgeon? Yes   Setting of reconstruction: outpatient office When was reconstruction performed? same day Type of reconstruction: linear Linear reconstruction: complex Length of linear repair (cm): 9  Opioids: Did the patient receive a prescription for opioid/narcotic related to Mohs surgery? Yes   Indications for opioid/narcotics: patient required additional pain relief despite trial of non-opioid analgesia  Antibiotics: Does patient meet AHA guidelines for endocarditis?: No   Does patient meet AHA guidelines for orthopedic prophylaxis?: No   Were antibiotics given on the day of surgery?: No   Did surgery breach mucosa, expose cartilage/bone, involve an area of lymphedema/inflamed/infected tissue? No    Skin repair Complexity:  Complex Final length (cm):  8.6 Informed consent: discussed and consent obtained   Timeout: patient name, date of birth, surgical site, and procedure verified   Procedure prep:  Patient was prepped and draped in usual sterile fashion Prep type:  Chlorhexidine Anesthesia: the lesion was anesthetized in a standard fashion   Anesthetic:  1% lidocaine w/ epinephrine 1-100,000 buffered w/ 8.4% NaHCO3 Reason for type of repair: reduce tension to allow closure and preserve  normal anatomy   Undermining: edges undermined    Subcutaneous layers (deep stitches):  Suture size:  3-0 Suture type: PDS (polydioxanone)   Stitches:  Buried vertical mattress Fine/surface layer approximation (top stitches):  Suture type: cyanoacrylate tissue glue   Suture removal (days):  0 Hemostasis achieved with: suture, pressure and electrodesiccation Outcome: patient tolerated procedure well with no complications   Post-procedure details: sterile dressing applied and wound care instructions given   Dressing type: pressure dressing and petrolatum   Related Medications oxyCODONE  (OXY IR/ROXICODONE ) 5 MG immediate release tablet Take 1 tablet (5 mg total) by mouth every 6 (six) hours as needed for up to 8 doses for severe pain (pain score 7-10). DYSPLASTIC NEVUS Left Shoulder - Posterior Skin excision - Left Shoulder - Posterior  Excision method:  elliptical Lesion length (cm):  0.8 Lesion width (cm):  0.7 Margin per side (cm):  0.5 Total excision diameter (cm):  1.8 Informed consent: discussed and consent obtained   Timeout: patient name, date of birth, surgical site, and procedure verified   Procedure prep:  Patient was prepped and draped in usual sterile fashion Prep type:  Chlorhexidine Anesthesia: the lesion was anesthetized in a standard fashion   Anesthetic:  1% lidocaine w/ epinephrine 1-100,000 buffered w/ 8.4% NaHCO3 Instrument used: #15 blade   Hemostasis achieved with: suture, pressure and electrodesiccation   Outcome: patient tolerated procedure well with no complications   Post-procedure details: sterile dressing applied and wound care instructions given   Dressing type: pressure dressing and petrolatum    Skin repair - Left Shoulder - Posterior Complexity:  Complex Final length (cm):  8.6 Informed consent: discussed and consent obtained   Timeout: patient name, date of birth, surgical site, and procedure verified   Procedure prep:  Patient was prepped and draped in usual sterile fashion Prep type:   Chlorhexidine Anesthesia: the lesion was anesthetized in a standard fashion   Anesthetic:  1% lidocaine w/ epinephrine 1-100,000 buffered w/ 8.4% NaHCO3 Reason for type of repair: reduce tension to allow closure and preserve normal anatomy   Undermining: edges could be approximated without difficulty   Subcutaneous layers (deep stitches):  Suture size:  3-0 Suture type: PDS (polydioxanone)   Stitches:  Buried vertical mattress Fine/surface layer approximation (top stitches):  Suture type: cyanoacrylate tissue glue   Suture removal (days):  0 Hemostasis achieved with: suture, pressure and electrodesiccation Outcome: patient tolerated procedure well with no complications   Post-procedure details: sterile dressing applied and wound care instructions given   Dressing type: pressure dressing, bandage and petrolatum     Return if symptoms worsen or fail to improve.  LILLETTE Berwyn Baseman, Surg Tech III, am acting as scribe for RUFUS CHRISTELLA HOLY, MD.    09/07/2023  HISTORY OF PRESENT ILLNESS  Jeffrey Barnett is seen in consultation at the request of Dr. Alm for biopsy-proven Superficial and Nodular Basal Cell Carcinoma on the left shoulder. They note that the area has been present for about 1 year increasing in size with time.  There is no history of previous treatment.  Reports no other new or changing lesions and has no other complaints today.  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 Alm, that will be excised today since it falls within the closure lines of the Mohs closure.  Medications and allergies: see patient chart.  Review of systems: Reviewed 8 systems and notable for the above skin cancer.  All other systems reviewed are unremarkable/negative,  unless noted in the HPI. Past medical history, surgical history, family history, social history were also reviewed and are noted in the chart/questionnaire.    PHYSICAL EXAMINATION  General: Well-appearing, in no acute  distress, alert and oriented x 4. Vitals reviewed in chart (if available).   Skin: Exam reveals a 3.1 x 2.3 cm erythematous papule and biopsy scar on the left shoulder. There are rhytids, telangiectasias, and lentigines, consistent with photodamage.   Biopsy report(s) reviewed, confirming the diagnosis.   ASSESSMENT  1) Superficial and Nodular Basal Cell Carcinoma of the left shoulder 2) photodamage 3) solar lentigines   PLAN   1. Due to location, size, histology, or recurrence and the likelihood of subclinical extension as well as the need to conserve normal surrounding tissue, the patient was deemed acceptable for Mohs micrographic surgery (MMS).  The nature and purpose of the procedure, associated benefits and risks including recurrence and scarring, possible complications such as pain, infection, and bleeding, and alternative methods of treatment if appropriate were discussed with the patient during consent. The lesion location was verified by the patient, by reviewing previous notes, pathology reports, and by photographs as well as angulation measurements if available.  Informed consent was reviewed and signed by the patient, and timeout was performed at 9:15 AM. See op note below.  2. For the photodamage and solar lentigines, sun protection discussed/information given on OTC sunscreens, and we recommend continued regular follow-up with primary dermatologist every 6 months or sooner for any growing, bleeding, or changing lesions. 3. Prognosis and future surveillance discussed. 4. Letter with treatment outcome sent to referring provider. 5. Pain acetaminophen/ibuprofen/oxycodone  5 mg   MOHS MICROGRAPHIC SURGERY AND RECONSTRUCTION  Initial size:   3.1 x 2.3 cm Surgical defect/wound size: 3.5 x 2.8 cm Anesthesia:    0.33% lidocaine with 1:200,000 epinephrine EBL:    <5 mL Complications:  None Repair type:   Complex SQ suture:   3-0 PDS Cutaneous suture:  Dermabond and Steri  strips Final size of the repair: 8.9 cm  Stages: 1  STAGE I: Anesthesia achieved with 0.5% lidocaine with 1:200,000 epinephrine. ChloraPrep applied. 1 section(s) excised using Mohs technique (this includes total peripheral and deep tissue margin excision and evaluation with frozen sections, excised and interpreted by the same physician). The tumor was first debulked and then excised with an approx. 2mm margin.  Hemostasis was achieved with electrocautery as needed.  The specimen was then oriented, subdivided/relaxed, inked, and processed using Mohs technique.    Frozen section analysis revealed a clear deep and peripheral margin.  Reconstruction  The surgical wound was then cleaned, prepped, and re-anesthetized as above. Wound edges were undermined extensively along at least one entire edge and at a distance equal to or greater than the width of the defect (see wound defect size above) in order to achieve closure and decrease wound tension and anatomic distortion. Redundant tissue repair including standing cone removal was performed. Hemostasis was achieved with electrocautery. Subcutaneous and epidermal tissues were approximated with the above sutures. The surgical site was then lightly scrubbed with sterile, saline-soaked gauze. Steri-strips were applied, and the area was then bandaged using Vaseline ointment, non-adherent gauze, gauze pads, and tape to provide an adequate pressure dressing. The patient tolerated the procedure well, was given detailed written and verbal wound care instructions, and was discharged in good condition.   The patient will follow-up: PRN.  Documentation: I have reviewed the above documentation for accuracy and completeness, and I agree with the above.  RUFUS CHRISTELLA HOLY, MD

## 2023-09-10 LAB — SURGICAL PATHOLOGY

## 2023-09-15 ENCOUNTER — Encounter: Payer: Self-pay | Admitting: Dermatology

## 2023-09-17 MED ORDER — PROMETHAZINE-DM 6.25-15 MG/5ML PO SYRP: 2.5000 mL | ORAL_SOLUTION | Freq: Three times a day (TID) | ORAL | 0 refills | Status: DC | PRN

## 2023-09-17 MED ORDER — BENZONATATE 100 MG PO CAPS: 100.0000 mg | ORAL_CAPSULE | Freq: Three times a day (TID) | ORAL | 0 refills | Status: DC | PRN

## 2023-09-17 NOTE — Telephone Encounter (Signed)

## 2023-09-21 ENCOUNTER — Ambulatory Visit: Payer: BC Managed Care – PPO | Admitting: Dermatology

## 2023-09-24 ENCOUNTER — Encounter: Payer: Self-pay | Admitting: Dermatology

## 2023-10-01 ENCOUNTER — Other Ambulatory Visit: Payer: Self-pay | Admitting: Family Medicine

## 2023-10-01 DIAGNOSIS — N529 Male erectile dysfunction, unspecified: Secondary | ICD-10-CM

## 2023-10-19 ENCOUNTER — Ambulatory Visit: Payer: BC Managed Care – PPO | Admitting: Family Medicine

## 2023-10-19 ENCOUNTER — Encounter: Payer: Self-pay | Admitting: Family Medicine

## 2023-10-19 VITALS — BP 129/77 | HR 71 | Temp 98.8°F | Ht 73.0 in | Wt 310.0 lb

## 2023-10-19 DIAGNOSIS — E118 Type 2 diabetes mellitus with unspecified complications: Secondary | ICD-10-CM | POA: Diagnosis not present

## 2023-10-19 DIAGNOSIS — E1169 Type 2 diabetes mellitus with other specified complication: Secondary | ICD-10-CM | POA: Diagnosis not present

## 2023-10-19 DIAGNOSIS — E1159 Type 2 diabetes mellitus with other circulatory complications: Secondary | ICD-10-CM | POA: Diagnosis not present

## 2023-10-19 DIAGNOSIS — Z7984 Long term (current) use of oral hypoglycemic drugs: Secondary | ICD-10-CM

## 2023-10-19 DIAGNOSIS — Z8619 Personal history of other infectious and parasitic diseases: Secondary | ICD-10-CM

## 2023-10-19 DIAGNOSIS — R3911 Hesitancy of micturition: Secondary | ICD-10-CM | POA: Diagnosis not present

## 2023-10-19 DIAGNOSIS — I152 Hypertension secondary to endocrine disorders: Secondary | ICD-10-CM

## 2023-10-19 DIAGNOSIS — E785 Hyperlipidemia, unspecified: Secondary | ICD-10-CM

## 2023-10-19 LAB — BAYER DCA HB A1C WAIVED: HB A1C (BAYER DCA - WAIVED): 7.4 % — ABNORMAL HIGH (ref 4.8–5.6)

## 2023-10-19 MED ORDER — ENALAPRIL MALEATE 10 MG PO TABS
5.0000 mg | ORAL_TABLET | Freq: Every day | ORAL | 3 refills | Status: AC
Start: 2023-10-19 — End: ?

## 2023-10-19 MED ORDER — TAMSULOSIN HCL 0.4 MG PO CAPS: 0.4000 mg | ORAL_CAPSULE | Freq: Every day | ORAL | 3 refills | Status: AC

## 2023-10-19 MED ORDER — METFORMIN HCL ER 500 MG PO TB24
500.0000 mg | ORAL_TABLET | Freq: Every day | ORAL | 3 refills | Status: AC
Start: 2023-10-19 — End: ?

## 2023-10-19 MED ORDER — VALACYCLOVIR HCL 1 G PO TABS: 2.0000 g | ORAL_TABLET | Freq: Two times a day (BID) | ORAL | 0 refills | Status: AC

## 2023-10-19 MED ORDER — ROSUVASTATIN CALCIUM 10 MG PO TABS: 10.0000 mg | ORAL_TABLET | Freq: Every day | ORAL | 3 refills | Status: AC

## 2023-10-19 NOTE — Progress Notes (Signed)
Subjective: CC:DM PCP: Raliegh Ip, DO ZOX:WRUEA D Jeffrey Barnett is a 63 y.o. male presenting to clinic today for:  1. Type 2 Diabetes with hypertension, hyperlipidemia:  He admits that he still has several work related dinners and sometimes he does really well but sometimes he does not.  He has lost little weight since last visit.  Compliant with cholesterol medication, enalapril  Diabetes Health Maintenance Due  Topic Date Due   HEMOGLOBIN A1C  01/06/2024   FOOT EXAM  01/08/2024   OPHTHALMOLOGY EXAM  05/30/2024    Last A1c:  Lab Results  Component Value Date   HGBA1C 7.1 (H) 07/09/2023    ROS: Denies dizziness, LOC, polyuria, polydipsia, unintended weight loss/gain, foot ulcerations, numbness or tingling in extremities, shortness of breath or chest pain.  2.  Urinary hesitancy Flomax has really helped.  He is only waking up at most once per night to urinate.  Would like to continue this medicine  3.  Cold sores He reports that he occasionally has cold sores and Valtrex has helped.  Needs refills  ROS: Per HPI  No Known Allergies Past Medical History:  Diagnosis Date   Fever blister    Hypertension    Other and unspecified hyperlipidemia    Villous adenoma    Wax in ear     Current Outpatient Medications:    benzonatate (TESSALON PERLES) 100 MG capsule, Take 1 capsule (100 mg total) by mouth 3 (three) times daily as needed for cough., Disp: 20 capsule, Rfl: 0   Cholecalciferol (VITAMIN D) 2000 UNITS CAPS, Take 1 capsule by mouth daily., Disp: , Rfl:    enalapril (VASOTEC) 10 MG tablet, TAKE 1/2 TABLET BY MOUTH DAILY, Disp: 45 tablet, Rfl: 1   Omega-3 Fatty Acids (FISH OIL) 1000 MG CAPS, Take 2 capsules by mouth daily., Disp: , Rfl:    oxyCODONE (OXY IR/ROXICODONE) 5 MG immediate release tablet, Take 1 tablet (5 mg total) by mouth every 6 (six) hours as needed for up to 8 doses for severe pain (pain score 7-10)., Disp: 8 tablet, Rfl: 0    promethazine-dextromethorphan (PROMETHAZINE-DM) 6.25-15 MG/5ML syrup, Take 2.5 mLs by mouth 3 (three) times daily as needed for cough., Disp: 118 mL, Rfl: 0   rosuvastatin (CRESTOR) 20 MG tablet, TAKE 1 TABLET BY MOUTH EVERY OTHER DAY, Disp: 45 tablet, Rfl: 0   sildenafil (REVATIO) 20 MG tablet, TAKE 2-5 TABLETS AS NEEDED ONCE DAILY FOR SEXUAL ACTIVITY, Disp: 100 tablet, Rfl: 0   tamsulosin (FLOMAX) 0.4 MG CAPS capsule, Take 1 capsule (0.4 mg total) by mouth daily., Disp: 90 capsule, Rfl: 1   valACYclovir (VALTREX) 1000 MG tablet, TAKE 1 TABLET BY MOUTH EVERY DAY AS DIRECTED, Disp: 90 tablet, Rfl: 0 Social History   Socioeconomic History   Marital status: Married    Spouse name: Not on file   Number of children: Not on file   Years of education: Not on file   Highest education level: Not on file  Occupational History   Not on file  Tobacco Use   Smoking status: Never   Smokeless tobacco: Never  Vaping Use   Vaping status: Never Used  Substance and Sexual Activity   Alcohol use: No   Drug use: No   Sexual activity: Not on file  Other Topics Concern   Not on file  Social History Narrative   Not on file   Social Drivers of Health   Financial Resource Strain: Not on file  Food Insecurity: No  Food Insecurity (09/29/2021)   Hunger Vital Sign    Worried About Running Out of Food in the Last Year: Never true    Ran Out of Food in the Last Year: Never true  Transportation Needs: Not on file  Physical Activity: Not on file  Stress: Not on file  Social Connections: Not on file  Intimate Partner Violence: Not on file   Family History  Problem Relation Age of Onset   Dementia Mother    Thyroid disease Father     Objective: Office vital signs reviewed. BP 129/77   Pulse 71   Temp 98.8 F (37.1 C)   Ht 6\' 1"  (1.854 m)   Wt (!) 310 lb (140.6 kg)   BMI 40.90 kg/m   Physical Examination:  General: Awake, alert, well nourished, No acute distress HEENT: No active lip  lesions Cardio: regular rate and rhythm, S1S2 heard, no murmurs appreciated Pulm: clear to auscultation bilaterally, no wheezes, rhonchi or rales; normal work of breathing on room air     Assessment/ Plan: 63 y.o. male   DM (diabetes mellitus), type 2 with complications (HCC) - Plan: Bayer DCA Hb A1c Waived, metFORMIN (GLUCOPHAGE-XR) 500 MG 24 hr tablet  Hyperlipidemia associated with type 2 diabetes mellitus (HCC) - Plan: rosuvastatin (CRESTOR) 10 MG tablet  Hypertension associated with diabetes (HCC) - Plan: enalapril (VASOTEC) 10 MG tablet  Urinary hesitancy - Plan: tamsulosin (FLOMAX) 0.4 MG CAPS capsule  Hx of cold sores - Plan: valACYclovir (VALTREX) 1000 MG tablet  Diabetes is not controlled and unfortunately A1c continues to rise at 7.4 today.  We are going to start him back on metformin.  Start extended release once daily.  Will plan to recheck in 3 months.  Would be a good candidate for GLP if he is interested given morbid obesity  Statin renewed.  I have adjusted the prescription to reflect current usage  Continue enalapril.  Blood pressure well-controlled  Flomax working well.  Continue current regimen  Valtrex renewed for as needed use for cold sores  Maybell Misenheimer Hulen Skains, DO [facility] [phone]

## 2023-10-27 ENCOUNTER — Other Ambulatory Visit: Payer: Self-pay | Admitting: Family Medicine

## 2023-10-27 DIAGNOSIS — N529 Male erectile dysfunction, unspecified: Secondary | ICD-10-CM

## 2024-01-10 ENCOUNTER — Encounter: Payer: BC Managed Care – PPO | Admitting: Family Medicine

## 2024-01-18 ENCOUNTER — Encounter: Payer: BC Managed Care – PPO | Admitting: Family Medicine

## 2024-01-25 ENCOUNTER — Ambulatory Visit (INDEPENDENT_AMBULATORY_CARE_PROVIDER_SITE_OTHER): Admitting: Family Medicine

## 2024-01-25 ENCOUNTER — Encounter: Payer: Self-pay | Admitting: Family Medicine

## 2024-01-25 VITALS — BP 120/70 | HR 67 | Temp 98.2°F | Ht 73.0 in | Wt 243.4 lb

## 2024-01-25 DIAGNOSIS — Z114 Encounter for screening for human immunodeficiency virus [HIV]: Secondary | ICD-10-CM

## 2024-01-25 DIAGNOSIS — E119 Type 2 diabetes mellitus without complications: Secondary | ICD-10-CM

## 2024-01-25 DIAGNOSIS — Z7984 Long term (current) use of oral hypoglycemic drugs: Secondary | ICD-10-CM

## 2024-01-25 DIAGNOSIS — E1159 Type 2 diabetes mellitus with other circulatory complications: Secondary | ICD-10-CM

## 2024-01-25 DIAGNOSIS — Z23 Encounter for immunization: Secondary | ICD-10-CM | POA: Diagnosis not present

## 2024-01-25 DIAGNOSIS — E785 Hyperlipidemia, unspecified: Secondary | ICD-10-CM | POA: Diagnosis not present

## 2024-01-25 DIAGNOSIS — Z0001 Encounter for general adult medical examination with abnormal findings: Secondary | ICD-10-CM | POA: Diagnosis not present

## 2024-01-25 DIAGNOSIS — E1169 Type 2 diabetes mellitus with other specified complication: Secondary | ICD-10-CM

## 2024-01-25 DIAGNOSIS — I152 Hypertension secondary to endocrine disorders: Secondary | ICD-10-CM

## 2024-01-25 DIAGNOSIS — Z1159 Encounter for screening for other viral diseases: Secondary | ICD-10-CM

## 2024-01-25 DIAGNOSIS — Z125 Encounter for screening for malignant neoplasm of prostate: Secondary | ICD-10-CM

## 2024-01-25 DIAGNOSIS — Z0000 Encounter for general adult medical examination without abnormal findings: Secondary | ICD-10-CM

## 2024-01-25 LAB — BAYER DCA HB A1C WAIVED: HB A1C (BAYER DCA - WAIVED): 6.2 % — ABNORMAL HIGH (ref 4.8–5.6)

## 2024-01-25 NOTE — Progress Notes (Signed)
 Jeffrey Barnett is a 62 y.o. male presents to office today for annual physical exam examination.    Concerns today include: 1. Type 2 Diabetes with hypertension, hyperlipidemia:  We started on extended release metformin  last visit due to uncontrolled blood sugar.  He is really been watching his diet.  No low blood sugars.  Last eye exam: Up-to-date Last foot exam: Needs Last A1c:  Lab Results  Component Value Date   HGBA1C 7.4 (H) 10/19/2023   Nephropathy screen indicated?:  Up-to-date Last flu, zoster and/or pneumovax:  Immunization History  Administered Date(s) Administered   Influenza, Seasonal, Injecte, Preservative Fre 07/09/2023   Influenza,inj,Quad PF,6+ Mos 08/08/2013, 09/13/2014, 09/01/2016, 07/29/2018, 10/02/2019, 06/24/2020, 09/17/2021, 07/10/2022   Influenza-Unspecified 07/10/2015   Moderna Sars-Covid-2 Vaccination 01/18/2020, 02/15/2020   Td 07/03/2008, 09/25/2019   Zoster Recombinant(Shingrix ) 11/11/2018, 09/25/2019    ROS: Denies dizziness, LOC, polyuria, polydipsia, unintended weight loss/gain, foot ulcerations, numbness or tingling in extremities, shortness of breath or chest pain.   Marital status: Married to Jeffrey Barnett, Substance use: Occasional social alcohol Health Maintenance Due  Topic Date Due   HIV Screening  Never done   Hepatitis C Screening  Never done   Pneumonia Vaccine 79+ Years old (1 of 2 - PCV) Never done   COLON CANCER SCREENING ANNUAL FOBT  03/25/2016   COVID-19 Vaccine (3 - Moderna risk series) 03/14/2020   Diabetic kidney evaluation - eGFR measurement  01/08/2024   FOOT EXAM  01/08/2024   Refills needed today: None  Immunization History  Administered Date(s) Administered   Influenza, Seasonal, Injecte, Preservative Fre 07/09/2023   Influenza,inj,Quad PF,6+ Mos 08/08/2013, 09/13/2014, 09/01/2016, 07/29/2018, 10/02/2019, 06/24/2020, 09/17/2021, 07/10/2022   Influenza-Unspecified 07/10/2015   Moderna Sars-Covid-2 Vaccination 01/18/2020,  02/15/2020   Td 07/03/2008, 09/25/2019   Zoster Recombinant(Shingrix ) 11/11/2018, 09/25/2019   Past Medical History:  Diagnosis Date   Fever blister    Hypertension    Other and unspecified hyperlipidemia    Villous adenoma    Wax in ear    Social History   Socioeconomic History   Marital status: Married    Spouse name: Not on file   Number of children: Not on file   Years of education: Not on file   Highest education level: Not on file  Occupational History   Not on file  Tobacco Use   Smoking status: Never   Smokeless tobacco: Never  Vaping Use   Vaping status: Never Used  Substance and Sexual Activity   Alcohol use: No   Drug use: No   Sexual activity: Not on file  Other Topics Concern   Not on file  Social History Narrative   Not on file   Social Drivers of Health   Financial Resource Strain: Not on file  Food Insecurity: No Food Insecurity (09/29/2021)   Hunger Vital Sign    Worried About Running Out of Food in the Last Year: Never true    Ran Out of Food in the Last Year: Never true  Transportation Needs: Not on file  Physical Activity: Not on file  Stress: Not on file  Social Connections: Not on file  Intimate Partner Violence: Not on file   Past Surgical History:  Procedure Laterality Date   APPENDECTOMY     SHOULDER SURGERY Left y-3   Family History  Problem Relation Age of Onset   Dementia Mother    Thyroid  disease Father     Current Outpatient Medications:    Cholecalciferol (VITAMIN D ) 2000 UNITS CAPS, Take  1 capsule by mouth daily., Disp: , Rfl:    enalapril  (VASOTEC ) 10 MG tablet, Take 0.5 tablets (5 mg total) by mouth daily., Disp: 45 tablet, Rfl: 3   metFORMIN  (GLUCOPHAGE -XR) 500 MG 24 hr tablet, Take 1 tablet (500 mg total) by mouth daily with breakfast., Disp: 90 tablet, Rfl: 3   Omega-3 Fatty Acids (FISH OIL) 1000 MG CAPS, Take 2 capsules by mouth daily., Disp: , Rfl:    oxyCODONE  (OXY IR/ROXICODONE ) 5 MG immediate release tablet,  Take 1 tablet (5 mg total) by mouth every 6 (six) hours as needed for up to 8 doses for severe pain (pain score 7-10)., Disp: 8 tablet, Rfl: 0   rosuvastatin  (CRESTOR ) 10 MG tablet, Take 1 tablet (10 mg total) by mouth daily. Note pill change, Disp: 90 tablet, Rfl: 3   sildenafil  (REVATIO ) 20 MG tablet, TAKE 2-5 TABLETS AS NEEDED ONCE DAILY FOR SEXUAL ACTIVITY, Disp: 300 tablet, Rfl: 1   tamsulosin  (FLOMAX ) 0.4 MG CAPS capsule, Take 1 capsule (0.4 mg total) by mouth daily., Disp: 90 capsule, Rfl: 3   valACYclovir  (VALTREX ) 1000 MG tablet, Take 2 tablets (2,000 mg total) by mouth 2 (two) times daily. X1 day per flare up of cold sores., Disp: 20 tablet, Rfl: 0  No Known Allergies   ROS: Review of Systems Pertinent items noted in HPI and remainder of comprehensive ROS otherwise negative.    Physical exam BP 120/70   Pulse 67   Temp 98.2 F (36.8 C)   Ht 6\' 1"  (1.854 m)   Wt 243 lb 6.4 oz (110.4 kg)   SpO2 97%   BMI 32.11 kg/m  General appearance: alert, cooperative, appears stated age, no distress, and mildly obese Head: Normocephalic, without obvious abnormality, atraumatic Eyes: negative findings: lids and lashes normal, conjunctivae and sclerae normal, corneas clear, and pupils equal, round, reactive to light and accomodation Ears: normal TM's and external ear canals both ears Nose: Nares normal. Septum midline. Mucosa normal. No drainage or sinus tenderness. Throat: lips, mucosa, and tongue normal; teeth and gums normal Neck: no adenopathy, supple, symmetrical, trachea midline, and thyroid  not enlarged, symmetric, no tenderness/mass/nodules Back: symmetric, no curvature. ROM normal. No CVA tenderness. Lungs: clear to auscultation bilaterally Chest wall: no tenderness Heart: regular rate and rhythm, S1, S2 normal, no murmur, click, rub or gallop Abdomen: soft, non-tender; bowel sounds normal; no masses,  no organomegaly Extremities: extremities normal, atraumatic, no cyanosis or  edema Pulses: 2+ and symmetric Skin: Skin color, texture, turgor normal. No rashes or lesions Lymph nodes: Cervical, supraclavicular, and axillary nodes normal. Neurologic: Grossly normal   Diabetic Foot Exam - Simple   Simple Foot Form Diabetic Foot exam was performed with the following findings: Yes 01/25/2024  2:44 PM  Visual Inspection No deformities, no ulcerations, no other skin breakdown bilaterally: Yes Sensation Testing Intact to touch and monofilament testing bilaterally: Yes Pulse Check Posterior Tibialis and Dorsalis pulse intact bilaterally: Yes Comments         01/25/2024    2:21 PM 10/19/2023    3:41 PM 07/09/2023    7:59 AM  Depression screen PHQ 2/9  Decreased Interest 0 0 0  Down, Depressed, Hopeless 0 0 0  PHQ - 2 Score 0 0 0  Altered sleeping 0 0 0  Tired, decreased energy 0 0 0  Change in appetite 0 0 0  Feeling bad or failure about yourself  0 0 0  Trouble concentrating 0 0 0  Moving slowly or fidgety/restless 0 0  0  Suicidal thoughts 0 0 0  PHQ-9 Score 0 0 0  Difficult doing work/chores Not difficult at all Not difficult at all Not difficult at all      01/25/2024    2:20 PM 10/19/2023    3:41 PM 07/09/2023    7:59 AM 01/08/2023    8:03 AM  GAD 7 : Generalized Anxiety Score  Nervous, Anxious, on Edge 0 0 0 0  Control/stop worrying 0 0 0 0  Worry too much - different things 0 0 0 0  Trouble relaxing 0 0 0 0  Restless 0 0 0 0  Easily annoyed or irritable 0 0 0 0  Afraid - awful might happen 0 0 0 0  Total GAD 7 Score 0 0 0 0  Anxiety Difficulty Not difficult at all Not difficult at all Not difficult at all Not difficult at all     Assessment/ Plan: Jeffrey Barnett here for annual physical exam.   Annual physical exam  Diabetes mellitus treated with oral medication (HCC) - Plan: Bayer DCA Hb A1c Waived, CMP14+EGFR, CBC  Hyperlipidemia associated with type 2 diabetes mellitus (HCC) - Plan: CMP14+EGFR, Lipid Panel  Hypertension associated  with diabetes (HCC) - Plan: CMP14+EGFR  Screening for malignant neoplasm of prostate - Plan: PSA  Encounter for screening for HIV - Plan: HIV Antibody (routine testing w rflx)  Need for hepatitis C screening test - Plan: Hepatitis C Antibody  Encounter for Prevnar pneumococcal vaccination - Plan: Pneumococcal conjugate vaccine 20-valent (Prevnar 20)  Sugar under good control 6.2 today. DM foot performed and was normal  Fasting labs collected.  Continue current regimen.  No refills needed  Blood pressure well-controlled.  Continue current regimen for management of prostate.  Symptoms are well-controlled with Flomax   Screening hepatitis C performed.  Pneumococcal vaccination administered  Counseled on healthy lifestyle choices, including diet (rich in fruits, vegetables and lean meats and low in salt and simple carbohydrates) and exercise (at least 30 minutes of moderate physical activity daily).  Patient to follow up 4-36m  Jeffrey Mccrystal M. Bonnell Butcher, DO

## 2024-01-25 NOTE — Patient Instructions (Signed)
 Preventive Care 73 Years and Older, Male Preventive care refers to lifestyle choices and visits with your health care provider that can promote health and wellness. Preventive care visits are also called wellness exams. What can I expect for my preventive care visit? Counseling During your preventive care visit, your health care provider may ask about your: Medical history, including: Past medical problems. Family medical history. History of falls. Current health, including: Emotional well-being. Home life and relationship well-being. Sexual activity. Memory and ability to understand (cognition). Lifestyle, including: Alcohol, nicotine or tobacco, and drug use. Access to firearms. Diet, exercise, and sleep habits. Work and work Astronomer. Sunscreen use. Safety issues such as seatbelt and bike helmet use. Physical exam Your health care provider will check your: Height and weight. These may be used to calculate your BMI (body mass index). BMI is a measurement that tells if you are at a healthy weight. Waist circumference. This measures the distance around your waistline. This measurement also tells if you are at a healthy weight and may help predict your risk of certain diseases, such as type 2 diabetes and high blood pressure. Heart rate and blood pressure. Body temperature. Skin for abnormal spots. What immunizations do I need?  Vaccines are usually given at various ages, according to a schedule. Your health care provider will recommend vaccines for you based on your age, medical history, and lifestyle or other factors, such as travel or where you work. What tests do I need? Screening Your health care provider may recommend screening tests for certain conditions. This may include: Lipid and cholesterol levels. Diabetes screening. This is done by checking your blood sugar (glucose) after you have not eaten for a while (fasting). Hepatitis C test. Hepatitis B test. HIV (human  immunodeficiency virus) test. STI (sexually transmitted infection) testing, if you are at risk. Lung cancer screening. Colorectal cancer screening. Prostate cancer screening. Abdominal aortic aneurysm (AAA) screening. You may need this if you are a current or former smoker. Talk with your health care provider about your test results, treatment options, and if necessary, the need for more tests. Follow these instructions at home: Eating and drinking  Eat a diet that includes fresh fruits and vegetables, whole grains, lean protein, and low-fat dairy products. Limit your intake of foods with high amounts of sugar, saturated fats, and salt. Take vitamin and mineral supplements as recommended by your health care provider. Do not drink alcohol if your health care provider tells you not to drink. If you drink alcohol: Limit how much you have to 0-2 drinks a day. Know how much alcohol is in your drink. In the [place] one drink equals one 12 oz bottle of beer (355 mL), one 5 oz glass of wine (148 mL), or one 1 oz glass of hard liquor (44 mL). Lifestyle Brush your teeth every morning and night with fluoride toothpaste. Floss one time each day. Exercise for at least 30 minutes 5 or more days each week. Do not use any products that contain nicotine or tobacco. These products include cigarettes, chewing tobacco, and vaping devices, such as e-cigarettes. If you need help quitting, ask your health care provider. Do not use drugs. If you are sexually active, practice safe sex. Use a condom or other form of protection to prevent STIs. Take aspirin only as told by your health care provider. Make sure that you understand how much to take and what form to take. Work with your health care provider to find out whether it is safe  and beneficial for you to take aspirin daily. Ask your health care provider if you need to take a cholesterol-lowering medicine (statin). Find healthy ways to manage stress, such  as: Meditation, yoga, or listening to music. Journaling. Talking to a trusted person. Spending time with friends and family. Safety Always wear your seat belt while driving or riding in a vehicle. Do not drive: If you have been drinking alcohol. Do not ride with someone who has been drinking. When you are tired or distracted. While texting. If you have been using any mind-altering substances or drugs. Wear a helmet and other protective equipment during sports activities. If you have firearms in your house, make sure you follow all gun safety procedures. Minimize exposure to UV radiation to reduce your risk of skin cancer. What's next? Visit your health care provider once a year for an annual wellness visit. Ask your health care provider how often you should have your eyes and teeth checked. Stay up to date on all vaccines. This information is not intended to replace advice given to you by your health care provider. Make sure you discuss any questions you have with your health care provider. Document Revised: 02/19/2021 Document Reviewed: 02/19/2021 Elsevier Patient Education  2024 [employer].

## 2024-01-26 ENCOUNTER — Ambulatory Visit: Payer: Self-pay | Admitting: Family Medicine

## 2024-01-26 LAB — LIPID PANEL
Chol/HDL Ratio: 2.6 ratio (ref 0.0–5.0)
Cholesterol, Total: 105 mg/dL (ref 100–199)
HDL: 41 mg/dL (ref >39)
LDL Chol Calc (NIH): 46 mg/dL (ref 0–99)
Triglycerides: 90 mg/dL (ref 0–149)
VLDL Cholesterol Cal: 18 mg/dL (ref 5–40)

## 2024-01-26 LAB — CMP14+EGFR
ALT: 14 IU/L (ref 0–44)
AST: 21 IU/L (ref 0–40)
Albumin: 4.4 g/dL (ref 3.9–4.9)
Alkaline Phosphatase: 83 IU/L (ref 44–121)
BUN/Creatinine Ratio: 16 (ref 10–24)
BUN: 15 mg/dL (ref 8–27)
Bilirubin Total: 0.7 mg/dL (ref 0.0–1.2)
CO2: 23 mmol/L (ref 20–29)
Calcium: 9.3 mg/dL (ref 8.6–10.2)
Chloride: 98 mmol/L (ref 96–106)
Creatinine, Ser: 0.95 mg/dL (ref 0.76–1.27)
Globulin, Total: 2.3 g/dL (ref 1.5–4.5)
Glucose: 167 mg/dL — ABNORMAL HIGH (ref 70–99)
Potassium: 4.9 mmol/L (ref 3.5–5.2)
Sodium: 134 mmol/L (ref 134–144)
Total Protein: 6.7 g/dL (ref 6.0–8.5)
eGFR: 89 mL/min/{1.73_m2} (ref >59)

## 2024-01-26 LAB — CBC
Hematocrit: 45.4 % (ref 37.5–51.0)
Hemoglobin: 14.9 g/dL (ref 13.0–17.7)
MCH: 29.3 pg (ref 26.6–33.0)
MCHC: 32.8 g/dL (ref 31.5–35.7)
MCV: 89 fL (ref 79–97)
Platelets: 183 10*3/uL (ref 150–450)
RBC: 5.08 x10E6/uL (ref 4.14–5.80)
RDW: 12.7 % (ref 11.6–15.4)
WBC: 5.2 10*3/uL (ref 3.4–10.8)

## 2024-01-26 LAB — PSA: Prostate Specific Ag, Serum: 2.8 ng/mL (ref 0.0–4.0)

## 2024-03-09 LAB — HM COLONOSCOPY

## 2024-07-04 ENCOUNTER — Encounter: Payer: Self-pay | Admitting: Family Medicine

## 2024-07-04 ENCOUNTER — Ambulatory Visit: Admitting: Family Medicine

## 2024-07-04 VITALS — BP 121/70 | HR 79 | Temp 97.3°F | Ht 72.0 in | Wt 247.4 lb

## 2024-07-04 DIAGNOSIS — Z7984 Long term (current) use of oral hypoglycemic drugs: Secondary | ICD-10-CM | POA: Diagnosis not present

## 2024-07-04 DIAGNOSIS — E1169 Type 2 diabetes mellitus with other specified complication: Secondary | ICD-10-CM

## 2024-07-04 DIAGNOSIS — I152 Hypertension secondary to endocrine disorders: Secondary | ICD-10-CM

## 2024-07-04 DIAGNOSIS — E1159 Type 2 diabetes mellitus with other circulatory complications: Secondary | ICD-10-CM

## 2024-07-04 DIAGNOSIS — Z23 Encounter for immunization: Secondary | ICD-10-CM | POA: Diagnosis not present

## 2024-07-04 DIAGNOSIS — E119 Type 2 diabetes mellitus without complications: Secondary | ICD-10-CM | POA: Insufficient documentation

## 2024-07-04 DIAGNOSIS — E785 Hyperlipidemia, unspecified: Secondary | ICD-10-CM | POA: Diagnosis not present

## 2024-07-04 LAB — BAYER DCA HB A1C WAIVED: HB A1C (BAYER DCA - WAIVED): 6.4 % — ABNORMAL HIGH (ref 4.8–5.6)

## 2024-07-04 NOTE — Progress Notes (Signed)
 Subjective: CC:DM PCP: Jeffrey Jeffrey HERO, DO YEP:Jeffrey Barnett is a 63 y.o. male presenting to clinic today for:  Type 2 Diabetes with hypertension, hyperlipidemia:  Patient is compliant with his metformin , Crestor  and enalapril .  No hypoglycemic episodes reported.  He had a new grandson since our last visit Jeffrey Barnett, whom they called Jeffrey Barnett.  ROS: Denies dizziness, LOC, polyuria, polydipsia, unintended weight loss/gain, foot ulcerations, numbness or tingling in extremities, shortness of breath or chest pain.   Diabetes Health Maintenance Due  Topic Date Due   OPHTHALMOLOGY EXAM  05/30/2024   HEMOGLOBIN A1C  07/27/2024   FOOT EXAM  01/24/2025    ROS: Per HPI  Not on File Past Medical History:  Diagnosis Date   Fever blister    Hypertension    Other and unspecified hyperlipidemia    Villous adenoma    Wax in ear     Current Outpatient Medications:    Cholecalciferol (VITAMIN D ) 2000 UNITS CAPS, Take 1 capsule by mouth daily., Disp: , Rfl:    enalapril  (VASOTEC ) 10 MG tablet, Take 0.5 tablets (5 mg total) by mouth daily., Disp: 45 tablet, Rfl: 3   metFORMIN  (GLUCOPHAGE -XR) 500 MG 24 hr tablet, Take 1 tablet (500 mg total) by mouth daily with breakfast., Disp: 90 tablet, Rfl: 3   Omega-3 Fatty Acids (FISH OIL) 1000 MG CAPS, Take 2 capsules by mouth daily., Disp: , Rfl:    rosuvastatin  (CRESTOR ) 10 MG tablet, Take 1 tablet (10 mg total) by mouth daily. Note pill change, Disp: 90 tablet, Rfl: 3   sildenafil  (REVATIO ) 20 MG tablet, TAKE 2-5 TABLETS AS NEEDED ONCE DAILY FOR SEXUAL ACTIVITY, Disp: 300 tablet, Rfl: 1   tamsulosin  (FLOMAX ) 0.4 MG CAPS capsule, Take 1 capsule (0.4 mg total) by mouth daily., Disp: 90 capsule, Rfl: 3   valACYclovir  (VALTREX ) 1000 MG tablet, Take 2 tablets (2,000 mg total) by mouth 2 (two) times daily. X1 day per flare up of cold sores., Disp: 20 tablet, Rfl: 0   oxyCODONE  (OXY IR/ROXICODONE ) 5 MG immediate release tablet, Take 1 tablet (5 mg total) by  mouth every 6 (six) hours as needed for up to 8 doses for severe pain (pain score 7-10). (Patient not taking: Reported on 07/04/2024), Disp: 8 tablet, Rfl: 0 Social History   Socioeconomic History   Marital status: Married    Spouse name: Not on file   Number of children: Not on file   Years of education: Not on file   Highest education level: Not on file  Occupational History   Not on file  Tobacco Use   Smoking status: Never   Smokeless tobacco: Never  Vaping Use   Vaping status: Never Used  Substance and Sexual Activity   Alcohol use: No   Drug use: No   Sexual activity: Not on file  Other Topics Concern   Not on file  Social History Narrative   Not on file   Social Drivers of Health   Financial Resource Strain: Not on file  Food Insecurity: No Food Insecurity (09/29/2021)   Hunger Vital Sign    Worried About Running Out of Food in the Last Year: Never true    Ran Out of Food in the Last Year: Never true  Transportation Needs: Not on file  Physical Activity: Not on file  Stress: Not on file  Social Connections: Not on file  Intimate Partner Violence: Not on file   Family History  Problem Relation Age of Onset   Dementia  Mother    Thyroid  disease Father     Objective: Office vital signs reviewed. BP 121/70   Pulse 79   Temp (!) 97.3 F (36.3 C)   Ht 6' (1.829 m)   Wt 247 lb 6 oz (112.2 kg)   SpO2 98%   BMI 33.55 kg/m   Physical Examination:  General: Awake, alert, obese but well-appearing, No acute distress HEENT: Sclera white.  Moist mucous membranes Cardio: regular rate and rhythm, S1S2 heard, no murmurs appreciated Pulm: clear to auscultation bilaterally, no wheezes, rhonchi or rales; normal work of breathing on room air   Lab Results  Component Value Date   HGBA1C 6.2 (H) 01/25/2024    Assessment/ Plan: 63 y.o. male   Diabetes mellitus treated with oral medication (HCC) - Plan: Microalbumin / creatinine urine ratio, Bayer DCA Hb A1c  Waived  Hyperlipidemia associated with type 2 diabetes mellitus (HCC)  Hypertension associated with diabetes (HCC)  Encounter for immunization - Plan: Flu vaccine HIGH DOSE PF(Fluzone Trivalent)  Check urine microalbumin, A1c.  Influenza vaccination given.  Continue all medications as prescribed as his chronic issues are stable.   Jeffrey CHRISTELLA Fielding, DO [facility] [phone]

## 2024-07-05 ENCOUNTER — Ambulatory Visit: Payer: Self-pay | Admitting: Family Medicine

## 2024-07-05 LAB — MICROALBUMIN / CREATININE URINE RATIO
Creatinine, Urine: 75.6 mg/dL
Microalb/Creat Ratio: 6 mg/g{creat} (ref 0–29)
Microalbumin, Urine: 4.9 ug/mL

## 2024-08-23 ENCOUNTER — Ambulatory Visit: Payer: BC Managed Care – PPO | Admitting: Dermatology

## 2024-08-23 ENCOUNTER — Encounter: Payer: Self-pay | Admitting: Dermatology

## 2024-08-23 VITALS — BP 125/78

## 2024-08-23 DIAGNOSIS — L578 Other skin changes due to chronic exposure to nonionizing radiation: Secondary | ICD-10-CM

## 2024-08-23 DIAGNOSIS — L821 Other seborrheic keratosis: Secondary | ICD-10-CM

## 2024-08-23 DIAGNOSIS — W908XXA Exposure to other nonionizing radiation, initial encounter: Secondary | ICD-10-CM

## 2024-08-23 DIAGNOSIS — D2261 Melanocytic nevi of right upper limb, including shoulder: Secondary | ICD-10-CM

## 2024-08-23 DIAGNOSIS — C4491 Basal cell carcinoma of skin, unspecified: Secondary | ICD-10-CM

## 2024-08-23 DIAGNOSIS — D229 Melanocytic nevi, unspecified: Secondary | ICD-10-CM

## 2024-08-23 DIAGNOSIS — D485 Neoplasm of uncertain behavior of skin: Secondary | ICD-10-CM

## 2024-08-23 DIAGNOSIS — Z86018 Personal history of other benign neoplasm: Secondary | ICD-10-CM | POA: Diagnosis not present

## 2024-08-23 DIAGNOSIS — L814 Other melanin hyperpigmentation: Secondary | ICD-10-CM | POA: Diagnosis not present

## 2024-08-23 DIAGNOSIS — Z85828 Personal history of other malignant neoplasm of skin: Secondary | ICD-10-CM | POA: Diagnosis not present

## 2024-08-23 DIAGNOSIS — D239 Other benign neoplasm of skin, unspecified: Secondary | ICD-10-CM

## 2024-08-23 DIAGNOSIS — Z1283 Encounter for screening for malignant neoplasm of skin: Secondary | ICD-10-CM

## 2024-08-23 DIAGNOSIS — L570 Actinic keratosis: Secondary | ICD-10-CM | POA: Diagnosis not present

## 2024-08-23 DIAGNOSIS — D1801 Hemangioma of skin and subcutaneous tissue: Secondary | ICD-10-CM

## 2024-08-23 NOTE — Progress Notes (Deleted)
° °  Total Body Skin Exam (TBSE) Visit   Subjective  Jeffrey Barnett is a 63 y.o. male who presents for the following: Skin Cancer Screening and Full Body Skin Exam  Patient presents today for follow up visit for TBSE. Patient was last evaluated on 09/07/2023 . Patient {Actions; denies-reports:120008} medication changes. Patient reports he  {Action; does/does not:19097} have spots, moles and lesions of concern to be evaluated. Patient reports throughout his lifetime he  has had {no/min/mod:60509::no} sun exposure. Currently, patient reports if he  has excessive sun exposure, he  {DOES_DOES WNU:81435} apply sunscreen and/or wears protective coverings. Patient reports he  {ACTIONS; HAS/DOES NOT HAVE:19233} hx of bx. Patient {Actions; denies/reports/admits to:19208}  family history of skin cancers. The patient has spots, moles and lesions to be evaluated, some may be new or changing and the patient has concerns that these could be cancer.  The following portions of the chart were reviewed this encounter and updated as appropriate: medications, allergies, medical history  Review of Systems:  No other skin or systemic complaints except as noted in HPI or Assessment and Plan.  Objective  Well appearing patient in no apparent distress; mood and affect are within normal limits.  A full examination was performed including scalp, head, eyes, ears, nose, lips, neck, chest, axillae, abdomen, back, buttocks, bilateral upper extremities, bilateral lower extremities, hands, feet, fingers, toes, fingernails, and toenails. All findings within normal limits unless otherwise noted below.   Relevant physical exam findings are noted in the Assessment and Plan.    Assessment & Plan   LENTIGINES, SEBORRHEIC KERATOSES, HEMANGIOMAS - Benign normal skin lesions - Benign-appearing - Call for any changes  MELANOCYTIC NEVI - Tan-brown and/or pink-flesh-colored symmetric macules and papules - Benign appearing on exam  today - Observation - Call clinic for new or changing moles - Recommend daily use of broad spectrum spf 30+ sunscreen to sun-exposed areas.   ACTINIC DAMAGE - Chronic condition, secondary to cumulative UV/sun exposure - diffuse scaly erythematous macules with underlying dyspigmentation - Recommend daily broad spectrum sunscreen SPF 30+ to sun-exposed areas, reapply every 2 hours as needed.  - Staying in the shade or wearing long sleeves, sun glasses (UVA+UVB protection) and wide brim hats (4-inch brim around the entire circumference of the hat) are also recommended for sun protection.  - Call for new or changing lesions.  SKIN CANCER SCREENING PERFORMED TODAY.     No follow-ups on file.  ***  Documentation: I have reviewed the above documentation for accuracy and completeness, and I agree with the above.  RUFUS CHRISTELLA HOLY, MD

## 2024-08-23 NOTE — Patient Instructions (Addendum)

## 2024-08-23 NOTE — Progress Notes (Signed)
 Total Body Skin Exam (TBSE) Visit   Subjective  Jeffrey Barnett is a 63 y.o. male ESTABLISHED PATIENT who presents for the following:  Patient (and/or pt guardian) consented to the use of AI-assisted tools for note generation.  Total Body Skin Exam (TBSE)  Patient was last evaluated for TBSE on 08/02/23 .  Patient does have spots of concern to be evaluated - scalp. He does apply sunscreen and/or wears protective coverings. Family Hx of skin cancer - mother BCC, maternal uncle melanoma.  Hx of Bx: sBCC - left shoulder - Mohs 09/07/23 DN - severe atypia - left superior shoulder  - Ex 09/07/23  The patient has spots, moles and lesions to be evaluated, some may be new or changing and the patient has concerns that these could be cancer.  The following portions of the chart were reviewed this encounter and updated as appropriate: medications, allergies, medical history  Review of Systems:  No other skin or systemic complaints except as noted in HPI or Assessment and Plan.  Objective  Well appearing patient in no apparent distress; mood and affect are within normal limits.  A full examination was performed including scalp, head, eyes, ears, nose, lips, neck, chest, axillae, abdomen, back, buttocks, bilateral upper extremities, bilateral lower extremities, hands, feet, fingers, toes, fingernails, and toenails. All findings within normal limits unless otherwise noted below.   Relevant physical exam findings are noted in the Assessment and Plan. Mid Frontal Scalp, R temple, L thigh (4) Erythematous thin papules/macules with gritty scale.  Right Shoulder - Anterior 7mm irregular brown macule  Assessment & Plan   History of Dysplastic Nevi - severe - left superior shoulder  - Ex 09/07/23 - No evidence of recurrence today - Recommend regular full body skin exams - Recommend daily broad spectrum sunscreen SPF 30+ to sun-exposed areas, reapply every 2 hours as needed.  - Call if any new  or changing lesions are noted between office visits  HISTORY OF SUPERFICIAL BASAL CELL CARCINOMA OF THE SKIN - left shoulder - Mohs 09/07/23 - No evidence of recurrence today - Recommend regular full body skin exams - Recommend daily broad spectrum sunscreen SPF 30+ to sun-exposed areas, reapply every 2 hours as needed.  - Call if any new or changing lesions are noted between office visits  LENTIGINES, SEBORRHEIC KERATOSES, HEMANGIOMAS - Benign normal skin lesions - Benign-appearing - Call for any changes  BENIGN MELANOCYTIC NEVI - Tan-brown and/or pink-flesh-colored symmetric macules and papules - Benign appearing on exam today - Observation - Call clinic for new or changing moles - Recommend daily use of broad spectrum spf 30+ sunscreen to sun-exposed areas.   MILD ACTINIC DAMAGE - Chronic condition, secondary to cumulative UV/sun exposure - diffuse scaly erythematous macules with underlying dyspigmentation - Recommend daily broad spectrum sunscreen SPF 30+ to sun-exposed areas, reapply every 2 hours as needed.  - Staying in the shade or wearing long sleeves, sun glasses (UVA+UVB protection) and wide brim hats (4-inch brim around the entire circumference of the hat) are also recommended for sun protection.  - Call for new or changing lesions.   SKIN CANCER SCREENING PERFORMED TODAY. DYSPLASTIC NEVUS   SUPERFICIAL BASAL CELL CARCINOMA (BCC)   AK (ACTINIC KERATOSIS) (4) Mid Frontal Scalp, R temple, L thigh (4) - Destruction of lesion - Mid Frontal Scalp, R temple, L thigh (4) Complexity: simple   Destruction method: cryotherapy   Informed consent: discussed and consent obtained   Timeout:  patient name, date of birth,  surgical site, and procedure verified Lesion destroyed using liquid nitrogen: Yes   Region frozen until ice ball extended beyond lesion: Yes   Outcome: patient tolerated procedure well with no complications   Post-procedure details: wound care instructions  given    NEOPLASM OF UNCERTAIN BEHAVIOR OF SKIN Right Shoulder - Anterior - Skin / nail biopsy Type of biopsy: tangential   Informed consent: discussed and consent obtained   Timeout: patient name, date of birth, surgical site, and procedure verified   Procedure prep:  Patient was prepped and draped in usual sterile fashion Prep type:  Isopropyl alcohol Anesthesia: the lesion was anesthetized in a standard fashion   Anesthetic:  1% lidocaine w/ epinephrine 1-100,000 buffered w/ 8.4% NaHCO3 Instrument used: DermaBlade   Hemostasis achieved with: aluminum chloride   Outcome: patient tolerated procedure well   Post-procedure details: sterile dressing applied and wound care instructions given   Dressing type: petrolatum gauze and bandage    Return in about 6 months (around 02/21/2025) for TBSE.   Documentation: I have reviewed the above documentation for accuracy and completeness, and I agree with the above.  I, Ginette Bradway Maranda, CMA II, am acting as scribe for:  Delon Lenis, DO

## 2024-08-25 LAB — SURGICAL PATHOLOGY

## 2024-08-27 ENCOUNTER — Ambulatory Visit: Payer: Self-pay | Admitting: Dermatology

## 2024-08-27 DIAGNOSIS — D239 Other benign neoplasm of skin, unspecified: Secondary | ICD-10-CM

## 2024-08-27 NOTE — Progress Notes (Signed)
 Hi Shirron,  Please contact pt and let him know the lesion bx'd will require treatment with Dr SHAUNNA.  FINAL DIAGNOSIS        1. Skin, right shoulder - anterior :       DYSPLASTIC COMPOUND NEVUS WITH SEVERE ATYPIA, CLOSE TO MARGIN, SEE DESCRIPTION

## 2024-08-29 ENCOUNTER — Encounter: Payer: Self-pay | Admitting: Dermatology

## 2024-08-29 ENCOUNTER — Ambulatory Visit: Admitting: Dermatology

## 2024-08-29 VITALS — BP 133/75 | HR 85

## 2024-08-29 DIAGNOSIS — D2361 Other benign neoplasm of skin of right upper limb, including shoulder: Secondary | ICD-10-CM

## 2024-08-29 DIAGNOSIS — D239 Other benign neoplasm of skin, unspecified: Secondary | ICD-10-CM

## 2024-08-29 NOTE — Patient Instructions (Signed)

## 2024-08-29 NOTE — Progress Notes (Signed)
" ° °  Follow-Up Visit   Subjective  Jeffrey Barnett is a 63 y.o. male who presents for the following: Excision of a DN severe on the right shoulder anterior, referred by Dr. Alm.  The following portions of the chart were reviewed this encounter and updated as appropriate: medications, allergies, medical history  Review of Systems:  No other skin or systemic complaints except as noted in HPI or Assessment and Plan.  Objective  Well appearing patient in no apparent distress; mood and affect are within normal limits.  A focused examination was performed of the following areas: Right shoulder anterior Relevant physical exam findings are noted in the Assessment and Plan.  Right Shoulder - Anterior Biopsy scar   Assessment & Plan   DYSPLASTIC NEVUS Right Shoulder - Anterior - Skin excision - Right Shoulder - Anterior  Excision method:  elliptical Lesion length (cm):  1.8 Lesion width (cm):  1.5 Margin per side (cm):  0.5 Total excision diameter (cm):  2.8 Informed consent: discussed and consent obtained   Timeout: patient name, date of birth, surgical site, and procedure verified   Procedure prep:  Patient was prepped and draped in usual sterile fashion Prep type:  Chlorhexidine Anesthesia: the lesion was anesthetized in a standard fashion   Anesthetic:  1% lidocaine w/ epinephrine 1-100,000 buffered w/ 8.4% NaHCO3 Instrument used: #15 blade   Hemostasis achieved with: suture, pressure and electrodesiccation   Hemostasis achieved with comment:  3.0 PDS with dermabond and steri strips Outcome: patient tolerated procedure well with no complications   Post-procedure details: sterile dressing applied and wound care instructions given   Dressing type: bandage and pressure dressing   Additional details:  Final length 7.0  - Skin repair - Right Shoulder - Anterior Complexity:  Complex Final length (cm):  7 Informed consent: discussed and consent obtained   Timeout: patient name,  date of birth, surgical site, and procedure verified   Procedure prep:  Patient was prepped and draped in usual sterile fashion Prep type:  Chlorhexidine Anesthesia: the lesion was anesthetized in a standard fashion   Anesthetic:  1% lidocaine w/ epinephrine 1-100,000 buffered w/ 8.4% NaHCO3 Reason for type of repair: reduce tension to allow closure, allow closure of the large defect and preserve normal anatomy   Undermining: area extensively undermined   Subcutaneous layers (deep stitches):  Suture size:  3-0 Suture type: PDS (polydioxanone)   Stitches:  Buried vertical mattress Fine/surface layer approximation (top stitches):  Suture type: cyanoacrylate tissue glue   Hemostasis achieved with: suture, pressure and electrodesiccation Outcome: patient tolerated procedure well with no complications   Post-procedure details: sterile dressing applied and wound care instructions given   Dressing type: bandage and pressure dressing    Specimen 1 - Surgical pathology Differential Diagnosis: DN [id number] Check Margins: No   Return if symptoms worsen or fail to improve.  I, Darice Smock, CMA, am acting as scribe for Jeffrey CHRISTELLA HOLY, MD.   Documentation: I have reviewed the above documentation for accuracy and completeness, and I agree with the above.  Jeffrey CHRISTELLA HOLY, MD  "

## 2024-08-30 LAB — SURGICAL PATHOLOGY

## 2024-09-02 ENCOUNTER — Ambulatory Visit: Payer: Self-pay | Admitting: Dermatology

## 2025-01-26 ENCOUNTER — Encounter: Payer: Self-pay | Admitting: Family Medicine
